# Patient Record
Sex: Female | Born: 1935
Health system: Southern US, Community
[De-identification: ages and names within clinical notes are randomized; demographics above are authoritative.]

## PROBLEM LIST (undated history)

## (undated) DIAGNOSIS — M810 Age-related osteoporosis without current pathological fracture: Secondary | ICD-10-CM

## (undated) DIAGNOSIS — I493 Ventricular premature depolarization: Secondary | ICD-10-CM

## (undated) DIAGNOSIS — E559 Vitamin D deficiency, unspecified: Secondary | ICD-10-CM

## (undated) HISTORY — DX: Ventricular premature depolarization: I49.3

## (undated) HISTORY — DX: Age-related osteoporosis without current pathological fracture: M81.0

## (undated) HISTORY — DX: Vitamin D deficiency, unspecified: E55.9

---

## 1967-06-24 HISTORY — PX: TUBAL LIGATION: SHX77

## 1999-05-23 ENCOUNTER — Encounter: Admission: RE | Admit: 1999-05-23 | Discharge: 1999-05-23 | Payer: Self-pay | Admitting: Obstetrics and Gynecology

## 1999-05-23 ENCOUNTER — Encounter: Payer: Self-pay | Admitting: Obstetrics and Gynecology

## 1999-12-10 ENCOUNTER — Other Ambulatory Visit: Admission: RE | Admit: 1999-12-10 | Discharge: 1999-12-10 | Payer: Self-pay | Admitting: Obstetrics and Gynecology

## 2000-02-12 ENCOUNTER — Encounter: Payer: Self-pay | Admitting: Obstetrics and Gynecology

## 2000-02-12 ENCOUNTER — Encounter: Admission: RE | Admit: 2000-02-12 | Discharge: 2000-02-12 | Payer: Self-pay | Admitting: Obstetrics and Gynecology

## 2001-03-24 ENCOUNTER — Other Ambulatory Visit: Admission: RE | Admit: 2001-03-24 | Discharge: 2001-03-24 | Payer: Self-pay | Admitting: Obstetrics and Gynecology

## 2001-03-31 ENCOUNTER — Encounter: Payer: Self-pay | Admitting: Obstetrics and Gynecology

## 2001-03-31 ENCOUNTER — Encounter: Admission: RE | Admit: 2001-03-31 | Discharge: 2001-03-31 | Payer: Self-pay | Admitting: Obstetrics and Gynecology

## 2002-03-31 ENCOUNTER — Other Ambulatory Visit: Admission: RE | Admit: 2002-03-31 | Discharge: 2002-03-31 | Payer: Self-pay | Admitting: Obstetrics and Gynecology

## 2002-04-04 ENCOUNTER — Encounter: Admission: RE | Admit: 2002-04-04 | Discharge: 2002-04-04 | Payer: Self-pay | Admitting: Obstetrics and Gynecology

## 2002-04-04 ENCOUNTER — Encounter: Payer: Self-pay | Admitting: Obstetrics and Gynecology

## 2003-03-24 ENCOUNTER — Encounter: Admission: RE | Admit: 2003-03-24 | Discharge: 2003-03-24 | Payer: Self-pay | Admitting: Obstetrics and Gynecology

## 2003-03-24 ENCOUNTER — Encounter: Payer: Self-pay | Admitting: Obstetrics and Gynecology

## 2003-04-18 ENCOUNTER — Encounter: Admission: RE | Admit: 2003-04-18 | Discharge: 2003-04-18 | Payer: Self-pay | Admitting: Obstetrics and Gynecology

## 2003-05-01 ENCOUNTER — Other Ambulatory Visit: Admission: RE | Admit: 2003-05-01 | Discharge: 2003-05-01 | Payer: Self-pay | Admitting: Obstetrics and Gynecology

## 2004-05-13 ENCOUNTER — Encounter: Admission: RE | Admit: 2004-05-13 | Discharge: 2004-05-13 | Payer: Self-pay | Admitting: Obstetrics and Gynecology

## 2004-09-20 ENCOUNTER — Ambulatory Visit: Payer: Self-pay | Admitting: Internal Medicine

## 2004-09-25 ENCOUNTER — Ambulatory Visit: Payer: Self-pay | Admitting: Cardiology

## 2004-11-27 ENCOUNTER — Encounter: Admission: RE | Admit: 2004-11-27 | Discharge: 2004-11-27 | Payer: Self-pay | Admitting: Obstetrics and Gynecology

## 2005-01-30 ENCOUNTER — Ambulatory Visit: Payer: Self-pay | Admitting: Gastroenterology

## 2005-02-12 ENCOUNTER — Ambulatory Visit: Payer: Self-pay | Admitting: Gastroenterology

## 2005-06-02 ENCOUNTER — Encounter: Admission: RE | Admit: 2005-06-02 | Discharge: 2005-06-02 | Payer: Self-pay | Admitting: Obstetrics and Gynecology

## 2005-06-24 ENCOUNTER — Encounter: Admission: RE | Admit: 2005-06-24 | Discharge: 2005-06-24 | Payer: Self-pay | Admitting: Family Medicine

## 2006-03-16 ENCOUNTER — Other Ambulatory Visit: Admission: RE | Admit: 2006-03-16 | Discharge: 2006-03-16 | Payer: Self-pay | Admitting: Obstetrics and Gynecology

## 2006-06-23 LAB — HM COLONOSCOPY

## 2006-06-23 LAB — HM DEXA SCAN

## 2006-07-23 ENCOUNTER — Encounter: Admission: RE | Admit: 2006-07-23 | Discharge: 2006-07-23 | Payer: Self-pay | Admitting: Obstetrics and Gynecology

## 2006-08-07 ENCOUNTER — Encounter: Admission: RE | Admit: 2006-08-07 | Discharge: 2006-08-07 | Payer: Self-pay | Admitting: Obstetrics and Gynecology

## 2007-08-20 ENCOUNTER — Encounter: Admission: RE | Admit: 2007-08-20 | Discharge: 2007-08-20 | Payer: Self-pay | Admitting: Obstetrics and Gynecology

## 2008-08-28 ENCOUNTER — Encounter: Admission: RE | Admit: 2008-08-28 | Discharge: 2008-08-28 | Payer: Self-pay | Admitting: Emergency Medicine

## 2008-08-29 LAB — HM DEXA SCAN

## 2009-09-07 ENCOUNTER — Encounter: Admission: RE | Admit: 2009-09-07 | Discharge: 2009-09-07 | Payer: Self-pay | Admitting: Obstetrics and Gynecology

## 2010-07-14 ENCOUNTER — Encounter: Payer: Self-pay | Admitting: Obstetrics and Gynecology

## 2010-10-07 ENCOUNTER — Other Ambulatory Visit: Payer: Self-pay | Admitting: Obstetrics and Gynecology

## 2010-10-07 DIAGNOSIS — Z1231 Encounter for screening mammogram for malignant neoplasm of breast: Secondary | ICD-10-CM

## 2010-10-09 ENCOUNTER — Ambulatory Visit
Admission: RE | Admit: 2010-10-09 | Discharge: 2010-10-09 | Disposition: A | Discharge status: Home / Self Care | Payer: Medicare Other | Source: Ambulatory Visit | Attending: Obstetrics and Gynecology | Admitting: Obstetrics and Gynecology

## 2010-10-09 DIAGNOSIS — Z1231 Encounter for screening mammogram for malignant neoplasm of breast: Secondary | ICD-10-CM

## 2011-10-09 ENCOUNTER — Other Ambulatory Visit: Payer: Self-pay | Admitting: Obstetrics and Gynecology

## 2011-10-09 DIAGNOSIS — Z1231 Encounter for screening mammogram for malignant neoplasm of breast: Secondary | ICD-10-CM

## 2011-10-28 ENCOUNTER — Ambulatory Visit: Payer: Medicare Other

## 2011-11-11 ENCOUNTER — Ambulatory Visit
Admission: RE | Admit: 2011-11-11 | Discharge: 2011-11-11 | Disposition: A | Discharge status: Home / Self Care | Payer: Medicare Other | Source: Ambulatory Visit | Attending: Obstetrics and Gynecology | Admitting: Obstetrics and Gynecology

## 2011-11-11 DIAGNOSIS — Z1231 Encounter for screening mammogram for malignant neoplasm of breast: Secondary | ICD-10-CM

## 2012-10-25 ENCOUNTER — Other Ambulatory Visit: Payer: Self-pay

## 2012-10-25 DIAGNOSIS — Z1231 Encounter for screening mammogram for malignant neoplasm of breast: Secondary | ICD-10-CM

## 2012-11-30 ENCOUNTER — Ambulatory Visit: Payer: PRIVATE HEALTH INSURANCE

## 2012-12-01 ENCOUNTER — Ambulatory Visit: Payer: PRIVATE HEALTH INSURANCE

## 2012-12-23 ENCOUNTER — Ambulatory Visit
Admission: RE | Admit: 2012-12-23 | Discharge: 2012-12-23 | Disposition: A | Discharge status: Home / Self Care | Payer: Medicare Other | Source: Ambulatory Visit

## 2012-12-23 DIAGNOSIS — Z1231 Encounter for screening mammogram for malignant neoplasm of breast: Secondary | ICD-10-CM

## 2013-05-12 ENCOUNTER — Encounter: Payer: Self-pay | Admitting: Family Medicine

## 2013-05-12 ENCOUNTER — Ambulatory Visit (INDEPENDENT_AMBULATORY_CARE_PROVIDER_SITE_OTHER): Payer: Medicare Other | Admitting: Family Medicine

## 2013-05-12 ENCOUNTER — Encounter (INDEPENDENT_AMBULATORY_CARE_PROVIDER_SITE_OTHER): Payer: Self-pay

## 2013-05-12 VITALS — BP 122/55 | HR 69 | Temp 98.9°F | Ht 59.0 in | Wt 100.6 lb

## 2013-05-12 DIAGNOSIS — H60399 Other infective otitis externa, unspecified ear: Secondary | ICD-10-CM

## 2013-05-12 DIAGNOSIS — H60391 Other infective otitis externa, right ear: Secondary | ICD-10-CM

## 2013-05-12 MED ORDER — NEOMYCIN-POLYMYXIN-HC 3.5-10000-1 OT SOLN: 4.0000 [drp] | Freq: Four times a day (QID) | OTIC | Status: DC

## 2013-05-12 NOTE — Patient Instructions (Signed)
Otitis Externa Otitis externa is a bacterial or fungal infection of the outer ear canal. This is the area from the eardrum to the outside of the ear. Otitis externa is sometimes called "swimmer's ear." CAUSES  Possible causes of infection include:  Swimming in dirty water.  Moisture remaining in the ear after swimming or bathing.  Mild injury (trauma) to the ear.  Objects stuck in the ear (foreign body).  Cuts or scrapes (abrasions) on the outside of the ear. SYMPTOMS  The first symptom of infection is often itching in the ear canal. Later signs and symptoms may include swelling and redness of the ear canal, ear pain, and yellowish-white fluid (pus) coming from the ear. The ear pain may be worse when pulling on the earlobe. DIAGNOSIS  Your caregiver will perform a physical exam. A sample of fluid may be taken from the ear and examined for bacteria or fungi. TREATMENT  Antibiotic ear drops are often given for 10 to 14 days. Treatment may also include pain medicine or corticosteroids to reduce itching and swelling. PREVENTION   Keep your ear dry. Use the corner of a towel to absorb water out of the ear canal after swimming or bathing.  Avoid scratching or putting objects inside your ear. This can damage the ear canal or remove the protective wax that lines the canal. This makes it easier for bacteria and fungi to grow.  Avoid swimming in lakes, polluted water, or poorly chlorinated pools.  You may use ear drops made of rubbing alcohol and vinegar after swimming. Combine equal parts of white vinegar and alcohol in a bottle. Put 3 or 4 drops into each ear after swimming. HOME CARE INSTRUCTIONS   Apply antibiotic ear drops to the ear canal as prescribed by your caregiver.  Only take over-the-counter or prescription medicines for pain, discomfort, or fever as directed by your caregiver.  If you have diabetes, follow any additional treatment instructions from your caregiver.  Keep all  follow-up appointments as directed by your caregiver. SEEK MEDICAL CARE IF:   You have a fever.  Your ear is still red, swollen, painful, or draining pus after 3 days.  Your redness, swelling, or pain gets worse.  You have a severe headache.  You have redness, swelling, pain, or tenderness in the area behind your ear. MAKE SURE YOU:   Understand these instructions.  Will watch your condition.  Will get help right away if you are not doing well or get worse. Document Released: 06/09/2005 Document Revised: 09/01/2011 Document Reviewed: 06/26/2011 ExitCare Patient Information 2014 [employer].  

## 2013-05-12 NOTE — Progress Notes (Signed)
  Subjective:    Patient ID: Julia Hinton, female    DOB: May 10, 1933, 79 y.o.   MRN: [id number]  HPI This 79 y.o. female presents for evaluation of right ear discomfort for 3 days.  She wears a hearing Aid and she states it hurts to wear her hearing aid.   Review of Systems No chest pain, SOB, HA, dizziness, vision change, N/V, diarrhea, constipation, dysuria, urinary urgency or frequency, myalgias, arthralgias or rash.     Objective:   Physical Exam Vital signs noted  Well developed well nourished female.  HEENT - Head atraumatic Normocephalic                Eyes - PERRLA, Conjuctiva - clear Sclera- Clear EOMI                Ears - EAC's with partial cerumen impaction bilateral and TM's normal. Respiratory - Lungs CTA bilateral Cardiac - RRR S1 and S2 without murmur GI - Abdomen soft Nontender and bowel sounds active x 4 Extremities - No edema. Neuro - Grossly intact.       Assessment & Plan:  Otitis, externa, infective, right - Plan: neomycin-polymyxin-hydrocortisone (CORTISPORIN) otic solution Deatra Canter FNP

## 2013-12-21 ENCOUNTER — Other Ambulatory Visit: Payer: Self-pay

## 2013-12-21 DIAGNOSIS — Z1231 Encounter for screening mammogram for malignant neoplasm of breast: Secondary | ICD-10-CM

## 2014-01-10 ENCOUNTER — Ambulatory Visit: Payer: PRIVATE HEALTH INSURANCE

## 2014-01-31 ENCOUNTER — Ambulatory Visit: Payer: PRIVATE HEALTH INSURANCE

## 2014-02-13 ENCOUNTER — Encounter (INDEPENDENT_AMBULATORY_CARE_PROVIDER_SITE_OTHER): Payer: Self-pay

## 2014-02-13 ENCOUNTER — Ambulatory Visit
Admission: RE | Admit: 2014-02-13 | Discharge: 2014-02-13 | Disposition: A | Discharge status: Home / Self Care | Payer: Medicare Other | Source: Ambulatory Visit

## 2014-02-13 DIAGNOSIS — Z1231 Encounter for screening mammogram for malignant neoplasm of breast: Secondary | ICD-10-CM

## 2014-03-14 ENCOUNTER — Ambulatory Visit (INDEPENDENT_AMBULATORY_CARE_PROVIDER_SITE_OTHER): Payer: Medicare Other | Admitting: *Deleted

## 2014-03-14 DIAGNOSIS — Z111 Encounter for screening for respiratory tuberculosis: Secondary | ICD-10-CM

## 2014-03-14 NOTE — Patient Instructions (Signed)

## 2014-03-14 NOTE — Progress Notes (Signed)
Patient ID: Julia Hinton, female   DOB: 02/13/1933, 80 y.o.   MRN: [id number] Ppd skin test placed on left forearm.

## 2014-03-16 ENCOUNTER — Encounter: Payer: Self-pay | Admitting: *Deleted

## 2014-03-16 LAB — TB SKIN TEST
Induration: 0 mm
TB Skin Test: NEGATIVE

## 2014-10-31 ENCOUNTER — Ambulatory Visit: Payer: Self-pay | Admitting: *Deleted

## 2014-11-29 ENCOUNTER — Other Ambulatory Visit: Payer: Self-pay

## 2014-11-29 DIAGNOSIS — Z1231 Encounter for screening mammogram for malignant neoplasm of breast: Secondary | ICD-10-CM

## 2015-02-20 ENCOUNTER — Ambulatory Visit
Admission: RE | Admit: 2015-02-20 | Discharge: 2015-02-20 | Disposition: A | Discharge status: Home / Self Care | Payer: Medicare Other | Source: Ambulatory Visit

## 2015-02-20 DIAGNOSIS — Z1231 Encounter for screening mammogram for malignant neoplasm of breast: Secondary | ICD-10-CM

## 2015-06-01 ENCOUNTER — Telehealth: Payer: Self-pay | Admitting: Family Medicine

## 2015-06-24 LAB — HM MAMMOGRAPHY: HM Mammogram: NORMAL (ref 0–4)

## 2015-09-04 DIAGNOSIS — H9193 Unspecified hearing loss, bilateral: Secondary | ICD-10-CM | POA: Insufficient documentation

## 2015-09-04 DIAGNOSIS — H6993 Unspecified Eustachian tube disorder, bilateral: Secondary | ICD-10-CM | POA: Insufficient documentation

## 2015-09-04 DIAGNOSIS — H9201 Otalgia, right ear: Secondary | ICD-10-CM | POA: Insufficient documentation

## 2016-03-19 ENCOUNTER — Other Ambulatory Visit: Payer: Self-pay | Admitting: Obstetrics and Gynecology

## 2016-03-19 DIAGNOSIS — Z1231 Encounter for screening mammogram for malignant neoplasm of breast: Secondary | ICD-10-CM

## 2016-04-08 ENCOUNTER — Ambulatory Visit
Admission: RE | Admit: 2016-04-08 | Discharge: 2016-04-08 | Disposition: A | Discharge status: Home / Self Care | Payer: Medicare Other | Source: Ambulatory Visit | Attending: Obstetrics and Gynecology | Admitting: Obstetrics and Gynecology

## 2016-04-08 ENCOUNTER — Other Ambulatory Visit: Payer: Self-pay | Admitting: Obstetrics and Gynecology

## 2016-04-08 DIAGNOSIS — Z1231 Encounter for screening mammogram for malignant neoplasm of breast: Secondary | ICD-10-CM

## 2016-04-08 LAB — HM MAMMOGRAPHY: HM Mammogram: NORMAL (ref 0–4)

## 2017-02-03 ENCOUNTER — Other Ambulatory Visit: Payer: Self-pay | Admitting: Obstetrics and Gynecology

## 2017-02-03 DIAGNOSIS — Z1231 Encounter for screening mammogram for malignant neoplasm of breast: Secondary | ICD-10-CM

## 2017-03-05 ENCOUNTER — Encounter: Payer: Self-pay | Admitting: *Deleted

## 2017-03-11 ENCOUNTER — Encounter: Payer: Self-pay | Admitting: Internal Medicine

## 2017-03-11 ENCOUNTER — Non-Acute Institutional Stay: Payer: Medicare Other | Admitting: Internal Medicine

## 2017-03-11 VITALS — BP 116/64 | HR 68 | Temp 98.2°F | Resp 18 | Ht 59.0 in

## 2017-03-11 DIAGNOSIS — I493 Ventricular premature depolarization: Secondary | ICD-10-CM

## 2017-03-11 DIAGNOSIS — E559 Vitamin D deficiency, unspecified: Secondary | ICD-10-CM

## 2017-03-11 DIAGNOSIS — Z13220 Encounter for screening for lipoid disorders: Secondary | ICD-10-CM

## 2017-03-11 DIAGNOSIS — M810 Age-related osteoporosis without current pathological fracture: Secondary | ICD-10-CM | POA: Insufficient documentation

## 2017-03-11 NOTE — Progress Notes (Signed)
[facility]  Provider: Blanchie Serve MD   Location:  [place] of Service:  Clinic (12)  PCP: Blanchie Serve, MD Patient Care Team: Blanchie Serve, MD as PCP - General (Internal Medicine) Califon, Nelda Bucks, NP as Nurse Practitioner (Family Medicine)  Extended Emergency Contact Information Primary Emergency Contact: Delduca,[name] Address: [address]          [place],  [address] [address] of [place] Phone: [phone] Mobile Phone: [phone] Relation: Spouse   Goals of Care: Advanced Directive information Advanced Directives 03/11/2017  Does Patient Have a Medical Advance Directive? Yes  Type of Paramedic of [place];Living will  Copy of [place] in Chart? No - copy requested      Chief Complaint  Patient presents with  . New Patient (Initial Visit)    Establishing care with [facility]. She has questions about the supplements she's currently taking.   . Medication Refill    No refills needed at this time.     HPI: Patient is a 83 y.o. female seen today to establish care. She has not had a PCP before for several years. She follows with her GYN once a year and follows with eye and ear doctors. She remotely had palpitations and was seen by cardiology and was diagnosed to have PVCs. Currently on calcium and vitamin d supplement only. Provides history of osteoporosis and having been on fosamax in the past. No other medical history.   Past Medical History:  Diagnosis Date  . Osteoporosis   . PVC (premature ventricular contraction)   . Vitamin D deficiency    Past Surgical History:  Procedure Laterality Date  . TUBAL LIGATION  1969    reports that she has never smoked. She has never used smokeless tobacco. She reports that she drinks about 1.2 oz of alcohol per week . She reports that she does not use drugs. Social History   Social History    . Marital status: Married    Spouse name: Jenny Reichmann  . Number of children: 4  . Years of education: 4   Occupational History  . Retired Education officer, museum    Social History Main Topics  . Smoking status: Never Smoker  . Smokeless tobacco: Never Used  . Alcohol use 1.2 oz/week    2 Glasses of wine per week  . Drug use: No  . Sexual activity: No   Other Topics Concern  . 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 Dementia Mother   . Osteoporosis Mother     Health Maintenance  Topic Date Due  . PNA vac Low Risk Adult (1 of 2 - PCV13) 02/16/2000  . INFLUENZA VACCINE  01/21/2017  . TETANUS/TDAP  02/15/2024  . DEXA SCAN  Completed    Allergies  Allergen Reactions  . Sulfa Antibiotics     Outpatient Encounter Prescriptions as of 03/11/2017  Medication  Sig  . Cholecalciferol 1000 units tablet Take 1,000 Units by mouth daily.  . Multiple Vitamins-Calcium (VIACTIV MULTI-VITAMIN) CHEW Chew 2 tablets by mouth daily.  Marland Kitchen neomycin-polymyxin-hydrocortisone (CORTISPORIN) otic solution Place 4 drops into the right ear 4 (four) times daily.   No facility-administered encounter medications on file as of 03/11/2017.     Review of Systems  Constitutional: Negative for appetite change, chills, diaphoresis, fatigue and fever.  HENT: Negative for congestion, hearing loss, mouth sores, rhinorrhea, sinus pressure, sore throat, tinnitus, trouble swallowing and voice change.        Seasonal allergies, occasional runny nose to left nostril  Eyes:       Wears corrective glasses, has had cataract surgery. Follows with Dr Katy Fitch  Respiratory: Negative  for cough, chest tightness, shortness of breath and wheezing.   Cardiovascular: Negative for chest pain, palpitations and leg swelling.  Gastrointestinal: Negative for abdominal pain, blood in stool, diarrhea, nausea and vomiting.  Genitourinary: Negative for dysuria, frequency, hematuria, pelvic pain and urgency.       Follows with Gyn once a year. Wakes up once at night to urinate  Musculoskeletal: Negative for arthralgias, back pain, gait problem and joint swelling.       No falls and no use of assistive device  Skin: Negative for color change.  Neurological: Negative for dizziness, seizures, syncope, light-headedness, numbness and headaches.       Has positional vertigo and slow position change helps  Hematological: Does not bruise/bleed easily.  Psychiatric/Behavioral: Negative for behavioral problems, confusion and sleep disturbance. The patient is not nervous/anxious.     Vitals:   03/11/17 0959  BP: 116/64  Pulse: 68  Resp: 18  Temp: 98.2 F (36.8 C)  TempSrc: Oral  SpO2: 99%  Height: 4' 11"  (1.499 m)   There is no height or weight on file to calculate BMI. Physical Exam  Constitutional: She is oriented to person, place, and time. She appears well-developed and well-nourished. No distress.  HENT:  Head: Normocephalic and atraumatic.  Nose: Nose normal.  Mouth/Throat: Oropharynx is clear and moist. No oropharyngeal exudate.  Right ear hearing aid, left ear impacted with cerumen  Eyes: Pupils are equal, round, and reactive to light. Conjunctivae and EOM are normal.  Has corrective glasses  Neck: Normal range of motion. Neck supple. No JVD present. No thyromegaly present.  Cardiovascular: Normal rate, regular rhythm and intact distal pulses.   No murmur heard. Pulmonary/Chest: Effort normal and breath sounds normal. No respiratory distress. She has no wheezes. She has no rales. She exhibits no tenderness.  Abdominal: Soft. Bowel sounds are normal. She exhibits no  distension and no mass. There is no tenderness. There is no rebound and no guarding.  Musculoskeletal: Normal range of motion. She exhibits deformity. She exhibits no edema.  Arthritis changes to fingers  Lymphadenopathy:    She has no cervical adenopathy.  Neurological: She is alert and oriented to person, place, and time.  Skin: Skin is warm and dry. No rash noted. She is not diaphoretic. No erythema.  Psychiatric: She has a normal mood and affect. Her behavior is normal. Judgment normal.    Labs reviewed: Basic Metabolic Panel: No results for input(s): NA, K, CL, CO2, GLUCOSE, BUN, CREATININE, CALCIUM, MG, PHOS in the last 8760 hours. Liver Function Tests: No results for input(s): AST, ALT, ALKPHOS, BILITOT, PROT, ALBUMIN in the last 8760 hours. No results for input(s): LIPASE, AMYLASE in the last 8760 hours. No results for input(s): AMMONIA in the  last 8760 hours. CBC: No results for input(s): WBC, NEUTROABS, HGB, HCT, MCV, PLT in the last 8760 hours. Cardiac Enzymes: No results for input(s): CKTOTAL, CKMB, CKMBINDEX, TROPONINI in the last 8760 hours. BNP: Invalid input(s): POCBNP No results found for: HGBA1C No results found for: TSH No results found for: VITAMINB12 No results found for: FOLATE No results found for: IRON, TIBC, FERRITIN  Lipid Panel: No results for input(s): CHOL, HDL, LDLCALC, TRIG, CHOLHDL, LDLDIRECT in the last 8760 hours. No results found for: HGBA1C  Procedures since last visit: No results found.  Assessment/Plan  1. Vitamin D deficiency Continue calcium and vitamin d supplement. - CBC with Differential/Platelets; Future - TSH; Future - Vitamin D, 25-hydroxy; Future - DG Bone Density; Future  2. Osteoporosis, unspecified osteoporosis type, unspecified pathological fracture presence No fall reported. Was taking fosamax in past, stopped for several years, no recent dexa scan, obtain one. Fall precautions. Continue calcium and vitamin d  supplement - CMP with eGFR; Future - Lipid Panel; Future - CBC with Differential/Platelets; Future - TSH; Future - DG Bone Density; Future  3. Screening for lipid disorders Check lipid panel with her age, none available for review  4. Asymptomatic PVCs Currently denies any symptom, regular heart rate on exam. - CMP with eGFR; Future - Lipid Panel; Future - CBC with Differential/Platelets; Future - TSH; Future   Labs/tests ordered:  As above  Next appointment: 6 months for annual exam, MMSE, EKG  Communication: reviewed care plan with patient    Blanchie Serve, MD Internal Medicine [place], [place] [address] Cell Phone (Monday-Friday 8 am - 5 pm): [phone] On Call: [phone] and follow prompts after 5 pm and on weekends Office Phone: [phone] Office Fax: [phone]

## 2017-04-10 ENCOUNTER — Ambulatory Visit: Payer: Medicare Other

## 2017-04-13 ENCOUNTER — Non-Acute Institutional Stay: Payer: Medicare Other

## 2017-04-16 ENCOUNTER — Telehealth: Payer: Self-pay

## 2017-04-16 NOTE — Telephone Encounter (Signed)
Left a message on the answer machine for the patient to call back to discuss lab appointment.

## 2017-04-17 ENCOUNTER — Other Ambulatory Visit: Payer: Self-pay

## 2017-04-17 DIAGNOSIS — E559 Vitamin D deficiency, unspecified: Secondary | ICD-10-CM

## 2017-04-17 DIAGNOSIS — Z13220 Encounter for screening for lipoid disorders: Secondary | ICD-10-CM

## 2017-04-17 DIAGNOSIS — M810 Age-related osteoporosis without current pathological fracture: Secondary | ICD-10-CM

## 2017-04-17 DIAGNOSIS — I493 Ventricular premature depolarization: Secondary | ICD-10-CM

## 2017-04-20 LAB — LIPID PANEL
Cholesterol: 231 mg/dL — ABNORMAL HIGH (ref <200)
HDL: 86 mg/dL (ref >50)
LDL Cholesterol (Calc): 127 mg/dL (calc) — ABNORMAL HIGH
Non-HDL Cholesterol (Calc): 145 mg/dL (calc) — ABNORMAL HIGH (ref <130)
Total CHOL/HDL Ratio: 2.7 (calc) (ref <5.0)
Triglycerides: 82 mg/dL (ref <150)

## 2017-04-20 LAB — CBC WITH DIFFERENTIAL/PLATELET
Basophils Absolute: 58 cells/uL (ref 0–200)
Basophils Relative: 0.8 %
Eosinophils Absolute: 180 cells/uL (ref 15–500)
Eosinophils Relative: 2.5 %
HCT: 44.2 % (ref 35.0–45.0)
Hemoglobin: 14.9 g/dL (ref 11.7–15.5)
Lymphs Abs: 2304 cells/uL (ref 850–3900)
MCH: 29.5 pg (ref 27.0–33.0)
MCHC: 33.7 g/dL (ref 32.0–36.0)
MCV: 87.5 fL (ref 80.0–100.0)
MPV: 10.3 fL (ref 7.5–12.5)
Monocytes Relative: 9.3 %
Neutro Abs: 3989 cells/uL (ref 1500–7800)
Neutrophils Relative %: 55.4 %
Platelets: 310 10*3/uL (ref 140–400)
RBC: 5.05 10*6/uL (ref 3.80–5.10)
RDW: 12.5 % (ref 11.0–15.0)
Total Lymphocyte: 32 %
WBC mixed population: 670 cells/uL (ref 200–950)
WBC: 7.2 10*3/uL (ref 3.8–10.8)

## 2017-04-20 LAB — COMPLETE METABOLIC PANEL WITH GFR
AG Ratio: 1.7 (calc) (ref 1.0–2.5)
ALT: 16 U/L (ref 6–29)
AST: 21 U/L (ref 10–35)
Albumin: 4.5 g/dL (ref 3.6–5.1)
Alkaline phosphatase (APISO): 57 U/L (ref 33–130)
BUN: 14 mg/dL (ref 7–25)
CO2: 31 mmol/L (ref 20–32)
Calcium: 9.9 mg/dL (ref 8.6–10.4)
Chloride: 100 mmol/L (ref 98–110)
Creat: 0.88 mg/dL (ref 0.60–0.88)
GFR, Est African American: 71 mL/min/{1.73_m2} (ref >=60)
GFR, Est Non African American: 61 mL/min/{1.73_m2} (ref >=60)
Globulin: 2.7 g/dL (calc) (ref 1.9–3.7)
Glucose, Bld: 87 mg/dL (ref 65–99)
Potassium: 4.2 mmol/L (ref 3.5–5.3)
Sodium: 141 mmol/L (ref 135–146)
Total Bilirubin: 0.6 mg/dL (ref 0.2–1.2)
Total Protein: 7.2 g/dL (ref 6.1–8.1)

## 2017-04-20 LAB — VITAMIN D 25 HYDROXY (VIT D DEFICIENCY, FRACTURES): Vit D, 25-Hydroxy: 65 ng/mL (ref 30–100)

## 2017-04-20 LAB — TSH: TSH: 3.79 mIU/L (ref 0.40–4.50)

## 2017-04-27 ENCOUNTER — Other Ambulatory Visit: Payer: Self-pay | Admitting: Internal Medicine

## 2017-04-27 DIAGNOSIS — M810 Age-related osteoporosis without current pathological fracture: Secondary | ICD-10-CM

## 2017-04-27 DIAGNOSIS — E559 Vitamin D deficiency, unspecified: Secondary | ICD-10-CM

## 2017-04-29 ENCOUNTER — Ambulatory Visit: Payer: Medicare Other

## 2017-04-29 ENCOUNTER — Other Ambulatory Visit: Payer: Medicare Other

## 2017-05-19 ENCOUNTER — Other Ambulatory Visit: Payer: Self-pay

## 2017-05-19 DIAGNOSIS — M810 Age-related osteoporosis without current pathological fracture: Secondary | ICD-10-CM

## 2017-05-19 DIAGNOSIS — I493 Ventricular premature depolarization: Secondary | ICD-10-CM

## 2017-05-22 ENCOUNTER — Ambulatory Visit: Payer: Medicare Other

## 2017-05-28 ENCOUNTER — Telehealth: Payer: Self-pay

## 2017-05-28 NOTE — Telephone Encounter (Signed)
Spoke with the patient and she stated that she rescheduled her Dexa scan and mammogram for 06/24/17.

## 2017-05-29 ENCOUNTER — Other Ambulatory Visit: Payer: Medicare Other

## 2017-06-24 ENCOUNTER — Ambulatory Visit
Admission: RE | Admit: 2017-06-24 | Discharge: 2017-06-24 | Disposition: A | Discharge status: Home / Self Care | Payer: Medicare Other | Source: Ambulatory Visit | Attending: Internal Medicine | Admitting: Internal Medicine

## 2017-06-24 ENCOUNTER — Ambulatory Visit
Admission: RE | Admit: 2017-06-24 | Discharge: 2017-06-24 | Disposition: A | Discharge status: Home / Self Care | Payer: Medicare Other | Source: Ambulatory Visit | Attending: Obstetrics and Gynecology | Admitting: Obstetrics and Gynecology

## 2017-06-24 DIAGNOSIS — Z1231 Encounter for screening mammogram for malignant neoplasm of breast: Secondary | ICD-10-CM

## 2017-06-24 DIAGNOSIS — E559 Vitamin D deficiency, unspecified: Secondary | ICD-10-CM

## 2017-06-24 DIAGNOSIS — M810 Age-related osteoporosis without current pathological fracture: Secondary | ICD-10-CM

## 2017-06-24 IMAGING — MG 2D DIGITAL SCREENING BILATERAL MAMMOGRAM WITH 3D TOMO WITH CAD
9 of 13 series · 9 of 29 positions shown · non-contrast
Comparison: Previous exam(s).

CLINICAL DATA: Screening.

EXAM:
2D DIGITAL SCREENING BILATERAL MAMMOGRAM WITH 3D TOMO WITH CAD

[R MLO (1 of 2)]
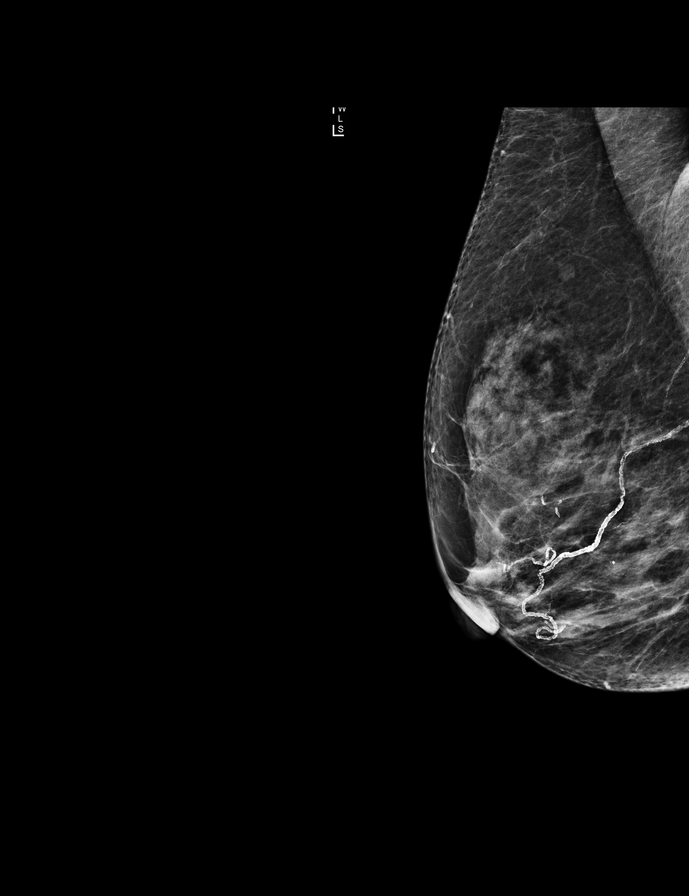

[L CC synth-2D]
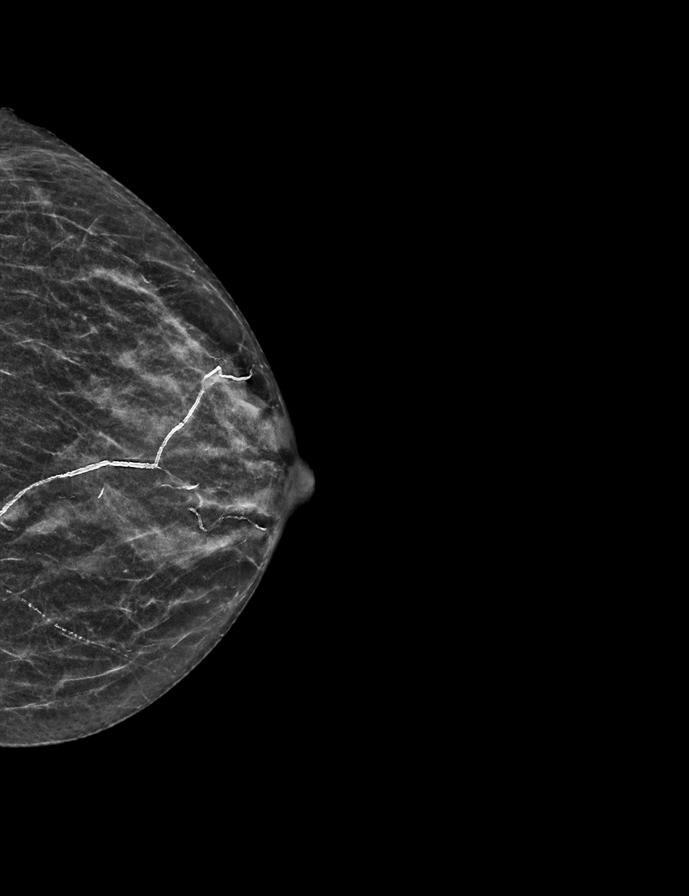

[R MLO (2 of 2)]
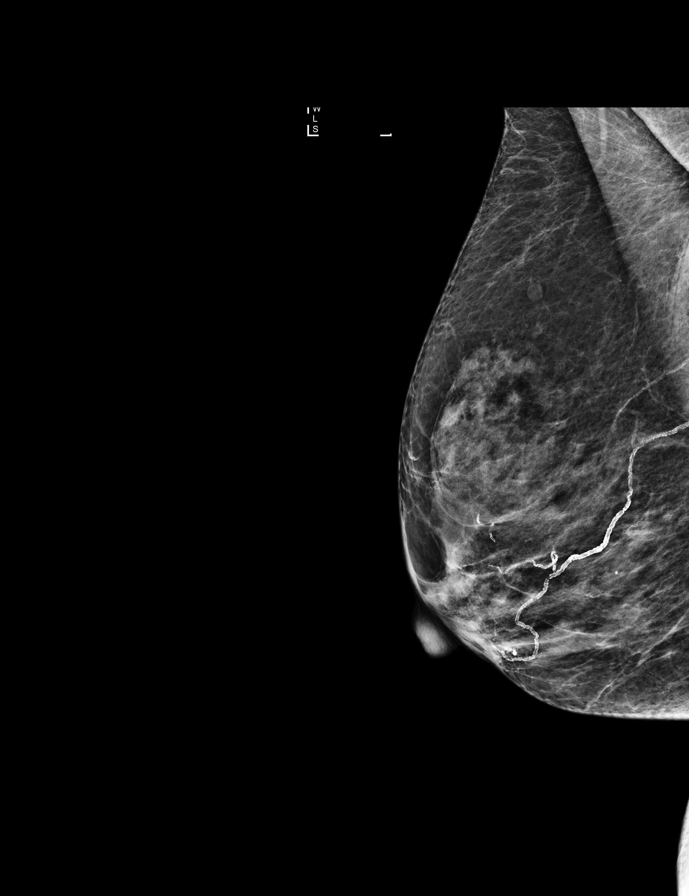

[R MLO synth-2D]
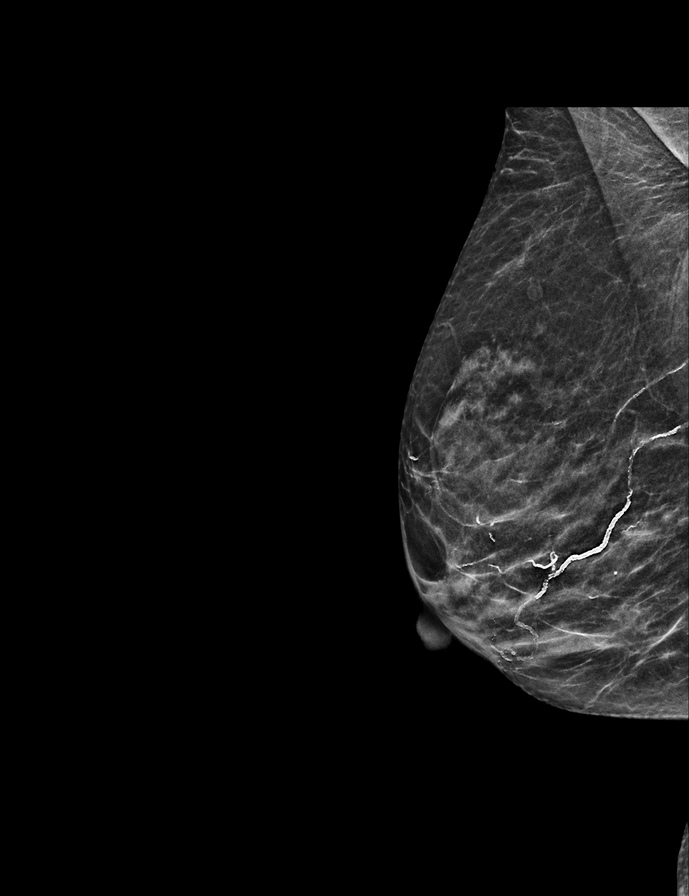

[R CC synth-2D]
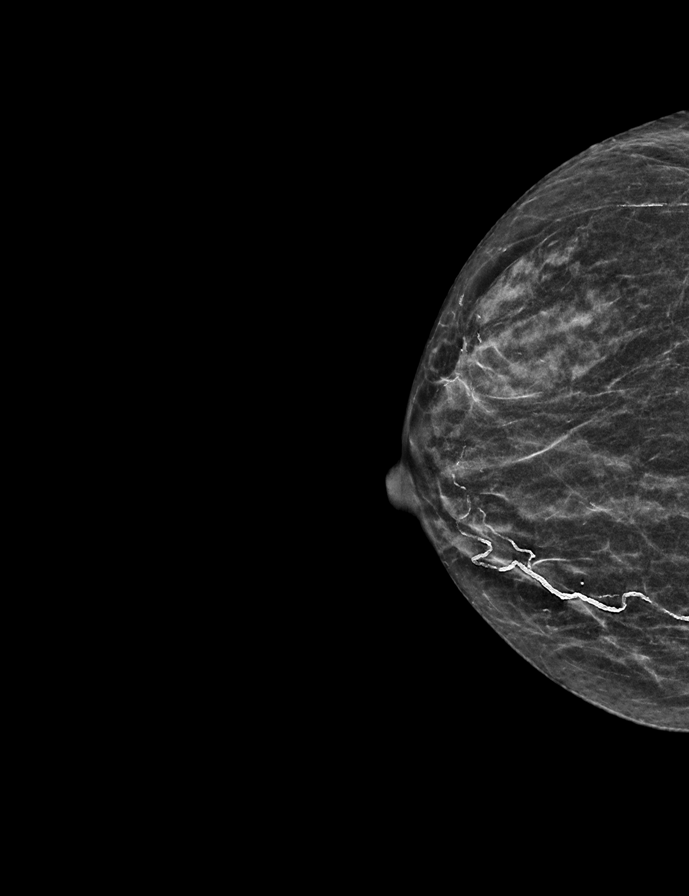

[L CC]
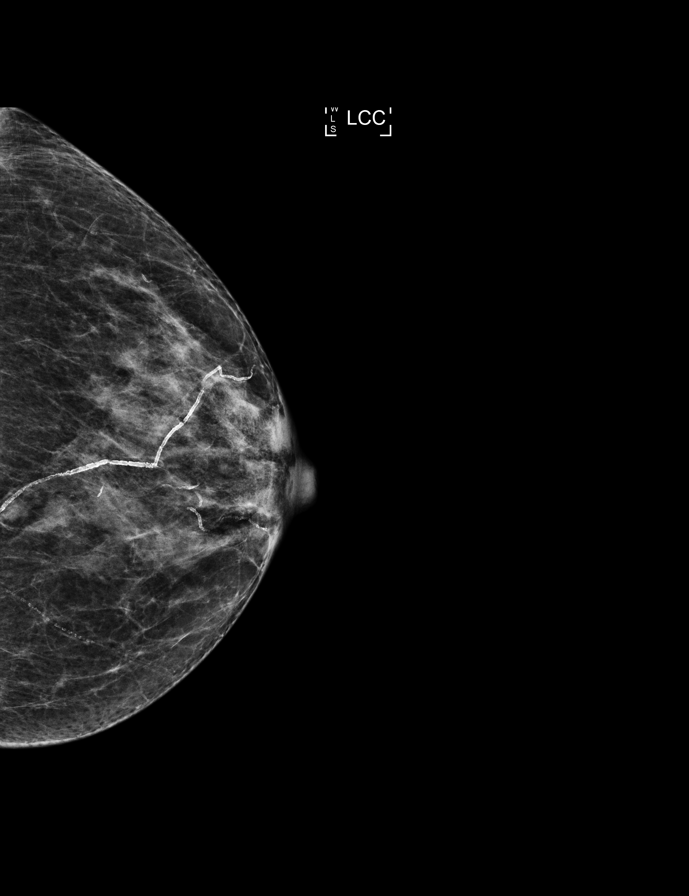

[L MLO]
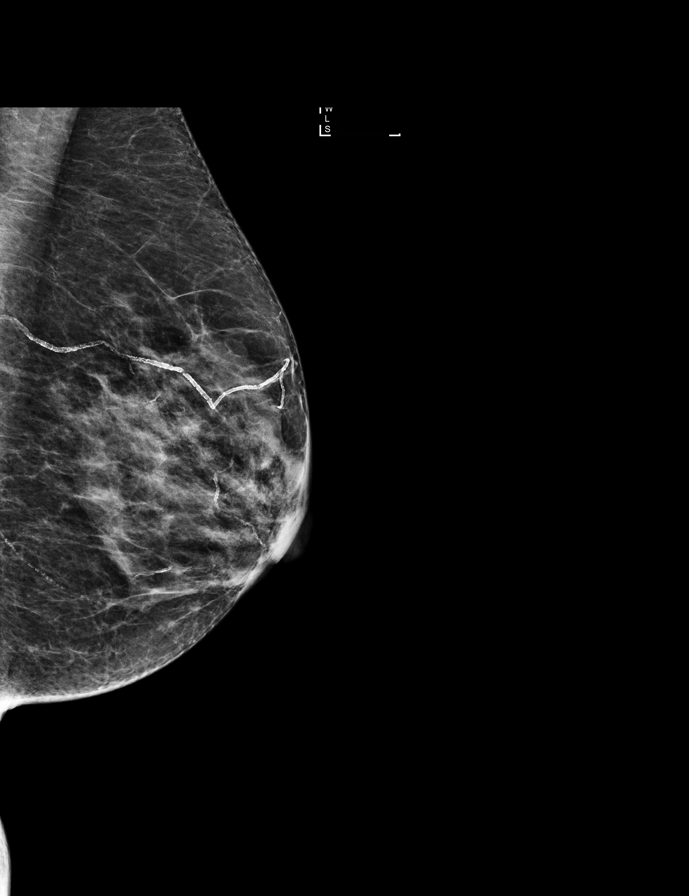

[R CC]
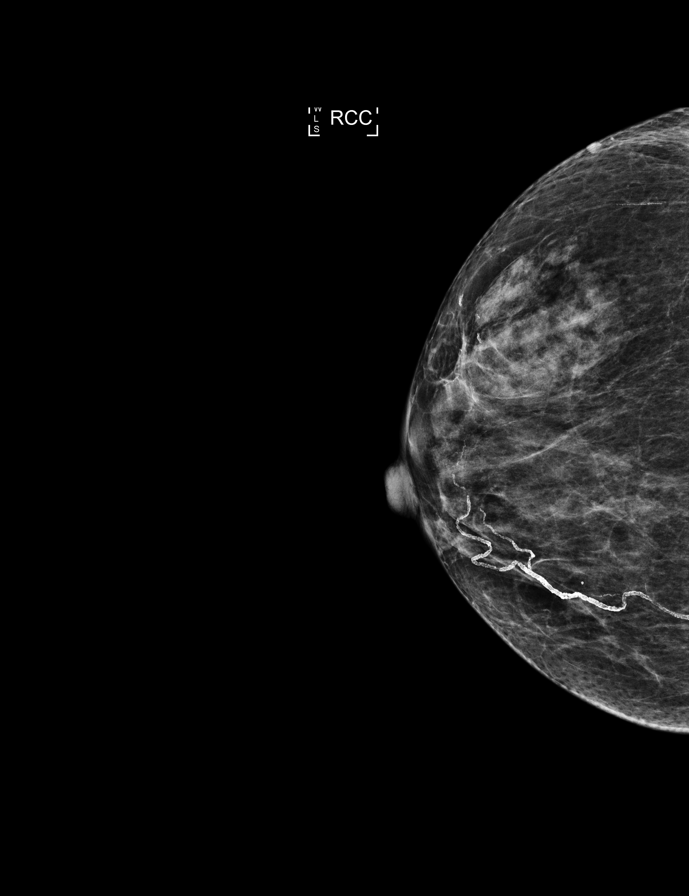

[L MLO synth-2D]
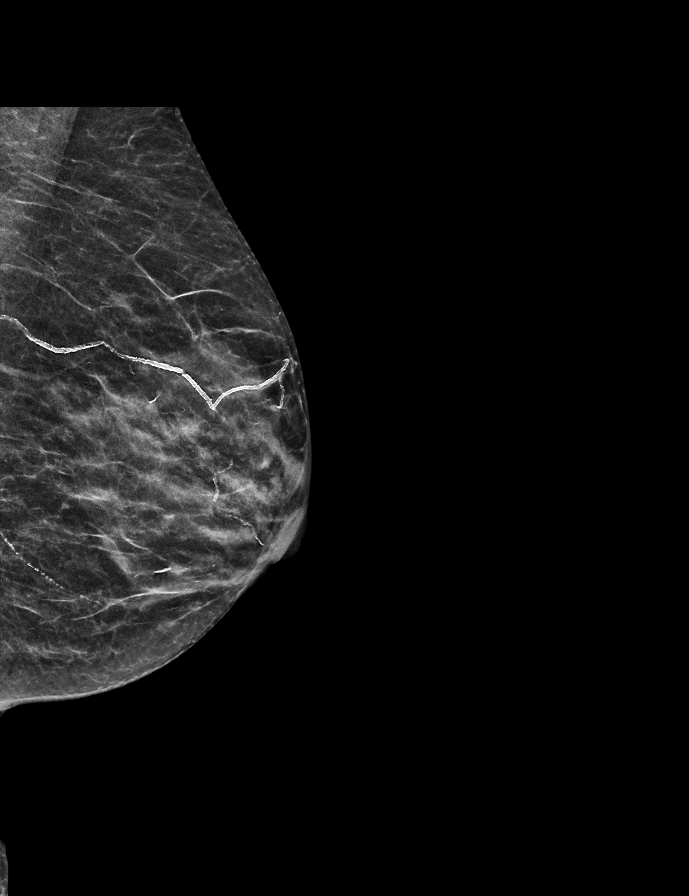

[9 of 29 positions shown; findings below may reference images not displayed]

ACR Breast Density Category c: The breast tissue is heterogeneously
dense, which may obscure small masses.
FINDINGS: There are no findings suspicious for malignancy. Images were
processed with CAD.
IMPRESSION: No mammographic evidence of malignancy. A result letter of this
screening mammogram will be mailed directly to the patient.

RECOMMENDATION:
Screening mammogram in one year. (Code:[4B])

BI-RADS CATEGORY  1: Negative.

## 2017-07-23 ENCOUNTER — Other Ambulatory Visit: Payer: Self-pay

## 2017-07-23 DIAGNOSIS — M810 Age-related osteoporosis without current pathological fracture: Secondary | ICD-10-CM

## 2017-07-23 DIAGNOSIS — I493 Ventricular premature depolarization: Secondary | ICD-10-CM

## 2017-08-03 LAB — CBC WITH DIFFERENTIAL/PLATELET
Basophils Absolute: 62 cells/uL (ref 0–200)
Basophils Relative: 1 %
Eosinophils Absolute: 149 cells/uL (ref 15–500)
Eosinophils Relative: 2.4 %
HCT: 45.6 % — ABNORMAL HIGH (ref 35.0–45.0)
Hemoglobin: 15.4 g/dL (ref 11.7–15.5)
Lymphs Abs: 1835 cells/uL (ref 850–3900)
MCH: 29.6 pg (ref 27.0–33.0)
MCHC: 33.8 g/dL (ref 32.0–36.0)
MCV: 87.7 fL (ref 80.0–100.0)
MPV: 10.1 fL (ref 7.5–12.5)
Monocytes Relative: 9.4 %
Neutro Abs: 3571 cells/uL (ref 1500–7800)
Neutrophils Relative %: 57.6 %
Platelets: 292 10*3/uL (ref 140–400)
RBC: 5.2 10*6/uL — ABNORMAL HIGH (ref 3.80–5.10)
RDW: 12.5 % (ref 11.0–15.0)
Total Lymphocyte: 29.6 %
WBC mixed population: 583 cells/uL (ref 200–950)
WBC: 6.2 10*3/uL (ref 3.8–10.8)

## 2017-08-03 LAB — COMPLETE METABOLIC PANEL WITH GFR
AG Ratio: 1.7 (calc) (ref 1.0–2.5)
ALT: 11 U/L (ref 6–29)
AST: 22 U/L (ref 10–35)
Albumin: 4.3 g/dL (ref 3.6–5.1)
Alkaline phosphatase (APISO): 52 U/L (ref 33–130)
BUN: 17 mg/dL (ref 7–25)
CO2: 29 mmol/L (ref 20–32)
Calcium: 9.8 mg/dL (ref 8.6–10.4)
Chloride: 103 mmol/L (ref 98–110)
Creat: 0.79 mg/dL (ref 0.60–0.88)
GFR, Est African American: 81 mL/min/{1.73_m2} (ref >=60)
GFR, Est Non African American: 70 mL/min/{1.73_m2} (ref >=60)
Globulin: 2.5 g/dL (calc) (ref 1.9–3.7)
Glucose, Bld: 81 mg/dL (ref 65–99)
Potassium: 4.3 mmol/L (ref 3.5–5.3)
Sodium: 142 mmol/L (ref 135–146)
Total Bilirubin: 0.5 mg/dL (ref 0.2–1.2)
Total Protein: 6.8 g/dL (ref 6.1–8.1)

## 2017-08-03 LAB — VITAMIN D 25 HYDROXY (VIT D DEFICIENCY, FRACTURES): Vit D, 25-Hydroxy: 73 ng/mL (ref 30–100)

## 2017-08-10 ENCOUNTER — Telehealth: Payer: Self-pay | Admitting: Internal Medicine

## 2017-08-10 NOTE — Telephone Encounter (Signed)
I left a message asking the patient to call me at [phone] [facility]22 or 2/27. VDM (DD)

## 2017-08-19 ENCOUNTER — Non-Acute Institutional Stay: Payer: Medicare Other

## 2017-08-19 VITALS — BP 132/60 | HR 68 | Temp 97.9°F | Ht 59.0 in | Wt 100.0 lb

## 2017-08-19 DIAGNOSIS — Z0000 Encounter for general adult medical examination without abnormal findings: Secondary | ICD-10-CM | POA: Diagnosis not present

## 2017-08-19 MED ORDER — ZOSTER VAC RECOMB ADJUVANTED 50 MCG/0.5ML IM SUSR: 0.5000 mL | Freq: Once | INTRAMUSCULAR | 1 refills | Status: AC

## 2017-08-19 NOTE — Patient Instructions (Signed)
Julia Hinton , Thank you for taking time to come for your Medicare Wellness Visit. I appreciate your ongoing commitment to your health goals. Please review the following plan we discussed and let me know if I can assist you in the future.   Screening recommendations/referrals: Colonoscopy excluded, you are over age 82 Mammogram up to date Bone Density up to date Recommended yearly ophthalmology/optometry visit for glaucoma screening and checkup Recommended yearly dental visit for hygiene and checkup  Vaccinations: Influenza vaccine up to date, due 2019 fall season Pneumococcal vaccine -please check your records before your visit next week Tdap vaccine up to date, due 02/15/2024 Shingles vaccine due, prescription sent to pharmacy    Advanced directives: in chart  Conditions/risks identified: none  Next appointment: Dr. Glade LloydPandey 08/26/2017 @ 10am   Preventive Care 65 Years and Older, Female Preventive care refers to lifestyle choices and visits with your health care provider that can promote health and wellness. What does preventive care include?  A yearly physical exam. This is also called an annual well check.  Dental exams once or twice a year.  Routine eye exams. Ask your health care provider how often you should have your eyes checked.  Personal lifestyle choices, including:  Daily care of your teeth and gums.  Regular physical activity.  Eating a healthy diet.  Avoiding tobacco and drug use.  Limiting alcohol use.  Practicing safe sex.  Taking low-dose aspirin every day.  Taking vitamin and mineral supplements as recommended by your health care provider. What happens during an annual well check? The services and screenings done by your health care provider during your annual well check will depend on your age, overall health, lifestyle risk factors, and family history of disease. Counseling  Your health care provider may ask you questions about your:  Alcohol  use.  Tobacco use.  Drug use.  Emotional well-being.  Home and relationship well-being.  Sexual activity.  Eating habits.  History of falls.  Memory and ability to understand (cognition).  Work and work Astronomerenvironment.  Reproductive health. Screening  You may have the following tests or measurements:  Height, weight, and BMI.  Blood pressure.  Lipid and cholesterol levels. These may be checked every 5 years, or more frequently if you are over 82 years old.  Skin check.  Lung cancer screening. You may have this screening every year starting at age 82 if you have a 30-pack-year history of smoking and currently smoke or have quit within the past 15 years.  Fecal occult blood test (FOBT) of the stool. You may have this test every year starting at age 82.  Flexible sigmoidoscopy or colonoscopy. You may have a sigmoidoscopy every 5 years or a colonoscopy every 10 years starting at age 82.  Hepatitis C blood test.  Hepatitis B blood test.  Sexually transmitted disease (STD) testing.  Diabetes screening. This is done by checking your blood sugar (glucose) after you have not eaten for a while (fasting). You may have this done every 1-3 years.  Bone density scan. This is done to screen for osteoporosis. You may have this done starting at age 82.  Mammogram. This may be done every 1-2 years. Talk to your health care provider about how often you should have regular mammograms. Talk with your health care provider about your test results, treatment options, and if necessary, the need for more tests. Vaccines  Your health care provider may recommend certain vaccines, such as:  Influenza vaccine. This is recommended every  year.  Tetanus, diphtheria, and acellular pertussis (Tdap, Td) vaccine. You may need a Td booster every 10 years.  Zoster vaccine. You may need this after age 61.  Pneumococcal 13-valent conjugate (PCV13) vaccine. One dose is recommended after age  11.  Pneumococcal polysaccharide (PPSV23) vaccine. One dose is recommended after age 51. Talk to your health care provider about which screenings and vaccines you need and how often you need them. This information is not intended to replace advice given to you by your health care provider. Make sure you discuss any questions you have with your health care provider. Document Released: 07/06/2015 Document Revised: 02/27/2016 Document Reviewed: 04/10/2015 Elsevier Interactive Patient Education  2017 [place] Prevention in the Home Falls can cause injuries. They can happen to people of all ages. There are many things you can do to make your home safe and to help prevent falls. What can I do on the outside of my home?  Regularly fix the edges of walkways and driveways and fix any cracks.  Remove anything that might make you trip as you walk through a door, such as a raised step or threshold.  Trim any bushes or trees on the path to your home.  Use bright outdoor lighting.  Clear any walking paths of anything that might make someone trip, such as rocks or tools.  Regularly check to see if handrails are loose or broken. Make sure that both sides of any steps have handrails.  Any raised decks and porches should have guardrails on the edges.  Have any leaves, snow, or ice cleared regularly.  Use sand or salt on walking paths during winter.  Clean up any spills in your garage right away. This includes oil or grease spills. What can I do in the bathroom?  Use night lights.  Install grab bars by the toilet and in the tub and shower. Do not use towel bars as grab bars.  Use non-skid mats or decals in the tub or shower.  If you need to sit down in the shower, use a plastic, non-slip stool.  Keep the floor dry. Clean up any water that spills on the floor as soon as it happens.  Remove soap buildup in the tub or shower regularly.  Attach bath mats securely with double-sided  non-slip rug tape.  Do not have throw rugs and other things on the floor that can make you trip. What can I do in the bedroom?  Use night lights.  Make sure that you have a light by your bed that is easy to reach.  Do not use any sheets or blankets that are too big for your bed. They should not hang down onto the floor.  Have a firm chair that has side arms. You can use this for support while you get dressed.  Do not have throw rugs and other things on the floor that can make you trip. What can I do in the kitchen?  Clean up any spills right away.  Avoid walking on wet floors.  Keep items that you use a lot in easy-to-reach places.  If you need to reach something above you, use a strong step stool that has a grab bar.  Keep electrical cords out of the way.  Do not use floor polish or wax that makes floors slippery. If you must use wax, use non-skid floor wax.  Do not have throw rugs and other things on the floor that can make you trip. What can  I do with my stairs?  Do not leave any items on the stairs.  Make sure that there are handrails on both sides of the stairs and use them. Fix handrails that are broken or loose. Make sure that handrails are as long as the stairways.  Check any carpeting to make sure that it is firmly attached to the stairs. Fix any carpet that is loose or worn.  Avoid having throw rugs at the top or bottom of the stairs. If you do have throw rugs, attach them to the floor with carpet tape.  Make sure that you have a light switch at the top of the stairs and the bottom of the stairs. If you do not have them, ask someone to add them for you. What else can I do to help prevent falls?  Wear shoes that:  Do not have high heels.  Have rubber bottoms.  Are comfortable and fit you well.  Are closed at the toe. Do not wear sandals.  If you use a stepladder:  Make sure that it is fully opened. Do not climb a closed stepladder.  Make sure that both  sides of the stepladder are locked into place.  Ask someone to hold it for you, if possible.  Clearly mark and make sure that you can see:  Any grab bars or handrails.  First and last steps.  Where the edge of each step is.  Use tools that help you move around (mobility aids) if they are needed. These include:  Canes.  Walkers.  Scooters.  Crutches.  Turn on the lights when you go into a dark area. Replace any light bulbs as soon as they burn out.  Set up your furniture so you have a clear path. Avoid moving your furniture around.  If any of your floors are uneven, fix them.  If there are any pets around you, be aware of where they are.  Review your medicines with your doctor. Some medicines can make you feel dizzy. This can increase your chance of falling. Ask your doctor what other things that you can do to help prevent falls. This information is not intended to replace advice given to you by your health care provider. Make sure you discuss any questions you have with your health care provider. Document Released: 04/05/2009 Document Revised: 11/15/2015 Document Reviewed: 07/14/2014 Elsevier Interactive Patient Education  2017 [employer].

## 2017-08-19 NOTE — Progress Notes (Addendum)
Subjective:   Julia Hinton is a 84 y.o. female who presents for an Initial Medicare Annual Wellness Visit at [facility]       Objective:    Today's Vitals   08/19/17 1538  BP: 132/60  Pulse: 68  Temp: 97.9 F (36.6 H[place])  TempSrc: Oral  SpO2: 97%  Weight: 100 lb (45.4 kg)  Height: 4\' 11"  (1.499 m)   Body mass index is 20.2 kg/m.  Advanced Directives 08/19/2017 03/11/2017  Does Patient Have a Medical Advance Directive? Yes Yes  Type of Estate agent of [place];Living will Healthcare Power of [place];Living will  Does patient want to make changes to medical advance directive? No - Patient declined -  Copy of Healthcare Power of Attorney in Chart? Yes No - copy requested    Current Medications (verified) Outpatient Encounter Medications as of 08/19/2017  Medication Sig  . Cholecalciferol 1000 units tablet Take 1,000 Units by mouth daily.  . Multiple Vitamins-Calcium (VIACTIV MULTI-VITAMIN) CHEW Chew 2 tablets by mouth daily.  Marland Kitchen Zoster Vaccine Adjuvanted [facility]) injection Inject 0.5 mLs into the muscle once for 1 dose.  . [DISCONTINUED] Zoster Vaccine Adjuvanted [facility]) injection Inject 0.5 mLs into the muscle once.   No facility-administered encounter medications on file as of 08/19/2017.     Allergies (verified) Sulfa antibiotics   History: Past Medical History:  Diagnosis Date  . Osteoporosis   . PVC (premature ventricular contraction)   . Vitamin D deficiency    Past Surgical History:  Procedure Laterality Date  . TUBAL LIGATION  1969   Family History  Problem Relation Age of Onset  . Dementia Mother   . Osteoporosis Mother    Social History   Socioeconomic History  . Marital status: Married    Spouse name: Julia Hinton  . Number of children: 4  . Years of education: 4  . Highest education level: None  Social Needs  . Financial resource strain: Not hard at all  . Food insecurity - worry: Never true    . Food insecurity - inability: Never true  . Transportation needs - medical: No  . Transportation needs - non-medical: No  Occupational History  . Occupation: Retired Engineer, site  Tobacco Use  . Smoking status: Never Smoker  . Smokeless tobacco: Never Used  Substance and Sexual Activity  . Alcohol use: Yes    Alcohol/week: 1.2 oz    Types: 2 Glasses of wine per week  . Drug use: No  . Sexual activity: No  Other Topics Concern  . 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   Walks "a little everyday"   Living Will, DNR and HCPOA per patient   + glasses       Tobacco Counseling Counseling given: Not Answered   Clinical Intake:  Pre-visit preparation completed: No  Pain : No/denies pain     Nutritional Risks: None Diabetes: No  How often do you need to have someone help you when you read instructions, pamphlets, or other written materials from your doctor or pharmacy?: 1 - Never What is the last grade level you completed in school?: College  Interpreter Needed?: No  Information entered by :: Julia Russell, RN   Activities of Daily Living In your present state of health,  do you have any difficulty performing the following activities: 08/19/2017  Hearing? Y  Vision? N  Difficulty concentrating or making decisions? N  Walking or climbing stairs? N  Dressing or bathing? N  Doing errands, shopping? N  Preparing Food and eating ? N  Using the Toilet? N  In the past six months, have you accidently leaked urine? N  Do you have problems with loss of bowel control? N  Managing your Medications? N  Managing your Finances? N  Housekeeping or managing your Housekeeping? N  Some recent data might be hidden     Immunizations and Health Maintenance Immunization History  Administered Date(s)  Administered  . Influenza Split 03/30/2013  . Influenza, High Dose Seasonal PF 04/01/2017  . Influenza-Unspecified 04/14/2014, 04/11/2015  . PPD Test 03/14/2014  . Tdap 02/14/2014   Health Maintenance Due  Topic Date Due  . PNA vac Low Risk Adult (1 of 2 - PCV13) 02/16/2000    Patient Care Team: Julia Hinton, Mahima, MD as PCP - General (Internal Medicine) Julia Hinton, Julia Citrininah C, NP as Nurse Practitioner (Family Medicine)  Indicate any recent Medical Services you may have received from other than [facility] providers in the past year (date may be approximate).     Assessment:   This is a routine wellness examination for Julia Hinton.  Hearing/Vision screen Hearing Screening Comments: R hearing aid Vision Screening Comments: Sees Dr. Lavona MoundGrout annually  Dietary issues and exercise activities discussed: Current Exercise Habits: Home exercise routine, Type of exercise: walking, Time (Minutes): 20, Frequency (Times/Week): 5, Weekly Exercise (Minutes/Week): 100, Intensity: Mild, Exercise limited by: None identified  Goals    None     Depression Screen PHQ 2/9 Scores 08/19/2017  PHQ - 2 Score 0    Fall Risk Fall Risk  08/19/2017  Falls in the past year? No    Is the patient's home free of loose throw rugs in walkways, pet beds, electrical cords, etc?   yes      Grab bars in the bathroom? yes      Handrails on the stairs?   yes      Adequate lighting?   yes  Cognitive Function: MMSE - Mini Mental State Exam 08/19/2017  Orientation to time 5  Orientation to Place 5  Registration 3  Attention/ Calculation 5  Recall 1  Language- name 2 objects 2  Language- repeat 1  Language- follow 3 step command 3  Language- read & follow direction 1  Write a sentence 1  Copy design 1  Total score 28        Screening Tests Health Maintenance  Topic Date Due  . PNA vac Low Risk Adult (1 of 2 - PCV13) 02/16/2000  . TETANUS/TDAP  02/15/2024  . INFLUENZA VACCINE  Completed  . DEXA SCAN  Completed     Qualifies for Shingles Vaccine? Yes, educated and prescription sent to pharmacy  Cancer Screenings: Lung: Low Dose CT Chest recommended if Age 62-80 years, 30 pack-year currently smoking OR have quit w/in 15years. Patient does not qualify. Breast: Up to date on Mammogram? Yes   Up to date of Bone Density/Dexa? Yes Colorectal: up to date  Additional Screenings:  Hepatitis [place] Screening: declined PNA vaccine-pt stated she got them and is going to double check her records     Plan:    I have personally reviewed and addressed the Medicare Annual Wellness questionnaire and have noted the following in the patient's chart:  A. Medical and social history B. Use of alcohol,  tobacco or illicit drugs  [place]. Current medications and supplements D. Functional ability and status E.  Nutritional status F.  Physical activity G. Advance directives H. List of other physicians I.  Hospitalizations, surgeries, and ER visits in previous 12 months J.  Vitals K. Screenings to include hearing, vision, cognitive, depression L. Referrals and appointments - none  In addition, I have reviewed and discussed with patient certain preventive protocols, quality metrics, and best practice recommendations. A written personalized care plan for preventive services as well as general preventive health recommendations were provided to patient.  See attached scanned questionnaire for additional information.   Signed,   Julia Russell, RN Nurse Health Advisor  Patient Concerns:None

## 2017-08-26 ENCOUNTER — Encounter: Payer: Self-pay | Admitting: Internal Medicine

## 2017-08-26 ENCOUNTER — Ambulatory Visit: Payer: Medicare Other | Admitting: Internal Medicine

## 2017-08-26 VITALS — BP 118/62 | HR 85 | Temp 97.4°F | Resp 16 | Ht 59.0 in | Wt 98.8 lb

## 2017-08-26 DIAGNOSIS — M810 Age-related osteoporosis without current pathological fracture: Secondary | ICD-10-CM | POA: Diagnosis not present

## 2017-08-26 DIAGNOSIS — Z0000 Encounter for general adult medical examination without abnormal findings: Secondary | ICD-10-CM | POA: Diagnosis not present

## 2017-08-26 DIAGNOSIS — I4510 Unspecified right bundle-branch block: Secondary | ICD-10-CM

## 2017-08-26 DIAGNOSIS — Z7185 Encounter for immunization safety counseling: Secondary | ICD-10-CM

## 2017-08-26 DIAGNOSIS — I493 Ventricular premature depolarization: Secondary | ICD-10-CM | POA: Diagnosis not present

## 2017-08-26 DIAGNOSIS — E559 Vitamin D deficiency, unspecified: Secondary | ICD-10-CM | POA: Diagnosis not present

## 2017-08-26 DIAGNOSIS — E785 Hyperlipidemia, unspecified: Secondary | ICD-10-CM | POA: Diagnosis not present

## 2017-08-26 DIAGNOSIS — Z7189 Other specified counseling: Secondary | ICD-10-CM

## 2017-08-26 MED ORDER — ALENDRONATE SODIUM 70 MG PO TABS: 70.0000 mg | ORAL_TABLET | ORAL | 3 refills | Status: DC

## 2017-08-26 NOTE — Progress Notes (Addendum)
[facility]  Provider: Blanchie Serve MD   Location:      Place of Service:     PCP: Blanchie Serve, MD Patient Care Team: Blanchie Serve, MD as PCP - General (Internal Medicine) Ngetich, Nelda Bucks, NP as Nurse Practitioner (Family Medicine)  Extended Emergency Contact Information Primary Emergency Contact: Yingling,[name] Address: [address]          [place], [place] [address] Johnnette Litter of [place] Phone: [phone] Mobile Phone: [phone] Relation: Spouse   Goals of Care: Advanced Directive information Advanced Directives 08/19/2017  Does Patient Have a Medical Advance Directive? Yes  Type of Paramedic of [place];Living will  Does patient want to make changes to medical advance directive? No - Patient declined  Copy of [place] in Chart? Yes      Chief Complaint  Patient presents with  . Annual Exam    annual physical  . Medication Refill    No refills needed at this time  . Results    Discuss labs    HPI: Patient is a 84 y.o. female seen today for annual physical visit. She mentions having received pneumococcal and shingles vaccine, unsure about dates.   PVCs- denies any symptom this visit.   Osteoporosis- dexa with T score -2.7 on 06/24/17. dexa 08/29/08 T score -1.9. Has been on fosamax in past with improved result. Then has been off fosamax for several years. No fall or fracture reported. Takes calcium and vitamin d supplement.   Vitamin d deficiency- takes vitamin d supplement.   Immunization History  Administered Date(s) Administered  . Influenza Split 03/30/2013  . Influenza, High Dose Seasonal PF 04/01/2017  . Influenza-Unspecified 04/14/2014, 04/11/2015  . PPD Test 03/14/2014  . Tdap 02/14/2014     Past Medical History:  Diagnosis Date  . Osteoporosis   . PVC (premature ventricular contraction)   . Vitamin D deficiency    Past Surgical History:    Procedure Laterality Date  . TUBAL LIGATION  1969    reports that  has never smoked. she has never used smokeless tobacco. She reports that she drinks about 1.2 oz of alcohol per week. She reports that she does not use drugs. Social History   Socioeconomic History  . Marital status: Married    Spouse name: Jenny Reichmann  . Number of children: 4  . Years of education: 4  . Highest education level: Not on file  Social Needs  . Financial resource strain: Not hard at all  . Food insecurity - worry: Never true  . Food insecurity - inability: Never true  . Transportation needs - medical: No  . Transportation needs - non-medical: No  Occupational History  . Occupation: Retired Education officer, museum  Tobacco Use  . Smoking status: Never Smoker  . Smokeless tobacco: Never Used  Substance and Sexual Activity  . Alcohol use: Yes    Alcohol/week: 1.2 oz    Types: 2 Glasses of wine per week  . Drug use: No  . Sexual activity: No  Other Topics Concern  . 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 Dementia Mother   . Osteoporosis Mother     Health Maintenance  Topic Date Due  . PNA vac Low Risk Adult (1 of 2 - PCV13) 02/16/2000  . TETANUS/TDAP  02/15/2024  . INFLUENZA VACCINE  Completed  . DEXA SCAN  Completed    Allergies  Allergen Reactions  . Sulfa Antibiotics     Outpatient Encounter Medications as of 08/26/2017  Medication Sig  . Cholecalciferol 1000 units tablet Take 1,000 Units by mouth daily.  . Multiple Vitamins-Calcium (VIACTIV MULTI-VITAMIN) CHEW Chew 2 tablets by mouth daily.  Marland Kitchen alendronate (FOSAMAX) 70 MG tablet Take 1 tablet (70 mg  total) by mouth once a week. Take with a full glass of water on an empty stomach.   No facility-administered encounter medications on file as of 08/26/2017.     Review of Systems  Constitutional: Negative for appetite change, chills and fever.  HENT: Positive for hearing loss. Negative for congestion, sinus pressure, sinus pain and trouble swallowing.   Eyes: Positive for visual disturbance. Negative for pain, discharge and itching.       History of cataract surgery to both eyes with placement of IOL  Respiratory: Negative for cough, shortness of breath and wheezing.   Cardiovascular: Negative for chest pain, palpitations and leg swelling.  Gastrointestinal: Negative for abdominal pain, blood in stool, constipation, diarrhea, nausea and vomiting.  Genitourinary: Negative for dysuria, hematuria and vaginal discharge.       Wakes up 1-2 times at night to urinate  Musculoskeletal: Negative for back pain and gait problem.       No fall reported  Skin: Negative for rash.  Neurological: Negative for dizziness, light-headedness, numbness and headaches.  Hematological: Does not bruise/bleed easily.  Psychiatric/Behavioral: Negative for behavioral problems, dysphoric mood, self-injury and suicidal ideas. The patient is not nervous/anxious.     Vitals:   08/26/17 1009  BP: 118/62  Pulse: 85  Resp: 16  Temp: (!) 97.4 F (36.3 C)  TempSrc: Oral  SpO2: 99%  Weight: 98 lb 12.8 oz (44.8 kg)  Height: _0  (1.499 m)   Body mass index is 19.96 kg/m.   Wt Readings from Last 3 Encounters:  08/26/17 98 lb 12.8 oz (44.8 kg)  08/19/17 100 lb (45.4 kg)  05/12/13 100 lb 9.6 oz (45.6 kg)   Physical Exam  Constitutional: She is oriented to person, place, and time. No distress.  Thin built, elderly female  HENT:  Head: Normocephalic and atraumatic.  Nose: Nose normal.  Mouth/Throat: Oropharynx is clear and moist. No oropharyngeal exudate.  Hearing aid to right ear, right ear canal normal,  normal tympanic membrane, left ear impacted with cerumen  Eyes: Conjunctivae and EOM are normal. Pupils are equal, round, and reactive to light. Right eye exhibits no discharge. Left eye exhibits no discharge.  Wears corrective glasses  Neck: Normal range of motion. Neck supple. No thyromegaly present.  Cardiovascular: Normal rate, regular rhythm and intact distal pulses.  Pulmonary/Chest: Effort normal and breath sounds normal. No respiratory distress. She has no wheezes. She has no rales.  Abdominal: Soft. Bowel sounds are normal. There is no tenderness. There is no rebound and no guarding.  Musculoskeletal: She exhibits no edema.  Steady gait, able to move all 4 extremities  Lymphadenopathy:    She has no cervical adenopathy.  Neurological: She is  alert and oriented to person, place, and time. No cranial nerve deficit. She exhibits normal muscle tone.  Skin: Skin is warm and dry. No rash noted. She is not diaphoretic.  Psychiatric: She has a normal mood and affect. Her behavior is normal.    Labs reviewed: Basic Metabolic Panel: Recent Labs    04/20/17 0750 08/03/17 0000  NA 141 142  K 4.2 4.3  CL 100 103  CO2 31 29  GLUCOSE 87 81  BUN 14 17  CREATININE 0.88 0.79  CALCIUM 9.9 9.8   Liver Function Tests: Recent Labs    04/20/17 0750 08/03/17 0000  AST 21 22  ALT 16 11  BILITOT 0.6 0.5  PROT 7.2 6.8   No results for input(s): LIPASE, AMYLASE in the last 8760 hours. No results for input(s): AMMONIA in the last 8760 hours. CBC: Recent Labs    04/20/17 0750 08/03/17 0000  WBC 7.2 6.2  NEUTROABS 3,989 3,571  HGB 14.9 15.4  HCT 44.2 45.6*  MCV 87.5 87.7  PLT 310 292   Cardiac Enzymes: No results for input(s): CKTOTAL, CKMB, CKMBINDEX, TROPONINI in the last 8760 hours. BNP: Invalid input(s): POCBNP No results found for: HGBA1C Lab Results  Component Value Date   TSH 3.79 04/20/2017   No results found for: VITAMINB12 No results found for: FOLATE No results  found for: IRON, TIBC, FERRITIN  Lipid Panel: Recent Labs    04/20/17 0750  CHOL 231*  HDL 86  TRIG 82  CHOLHDL 2.7   No results found for: HGBA1C  Procedures since last visit: No results found.   08/26/17 EKG- normal sinus rhythm, 63/min, normal PR interval, LAD, right bundle branch block with broad QRS complex  Assessment/Plan  1. Asymptomatic PVCs Asymptomatic, monitor  2. Osteoporosis, unspecified osteoporosis type, unspecified pathological fracture presence Place her on fosamax 70 mg once a week. Continue calcium and vitamin d current regimen. Recheck dexa in 2 years. Continue weight bearing exercises as tolerated.   3. Vitamin D deficiency Continue vitamin d supplemen  4. Immunization counseling uptodate on influenza and tdap. Pt mentions she is uptodate with pneumococcal and shingles vaccine, think it was in 2014, she has dates in her apartment, will bring it to [place] after clinic. On review, if she is due for any immunization, will provide script for it.   5. Hyperlipidemia, unspecified hyperlipidemia type Elevated total cholesterol. Patient wants to try diet and exercise first and if no improvement will consider medication. Reading material for diet for cholesterol provided.  - CMP with eGFR([facility]) - Lipid Panel  6. Right bundle branch block (RBBB) on electrocardiogram (ECG) Asymptomatic, denies palpitations or syncope/ presyncope episode. Monitor clinically. No old ekg to compare with  7. Annual physical exam uptodate on mammogram, dexa scan, pelvic exam, immunization except for the above ones. Labs reviewed. the patient was counseled regarding the appropriate use of alcohol, regular self-examination of the breasts on a monthly basis, prevention of dental and periodontal disease, diet, regular sustained exercise for at least 30 minutes 5 times per week, the proper use of sunscreen and protective clothing, cholesterol, thyroid and diabetes screening.    Labs/tests  ordered:   Lab Orders     CMP with eGFR([facility])     Lipid Panel   Next appointment: 6 months  Communication: reviewed care plan with patient     Blanchie Serve, MD Internal Medicine [facility] [address] [place], [place] [address] Cell Phone (Monday-Friday 8 am -  5 pm): [phone] On Call: [phone] and follow prompts after 5 pm and on weekends Office Phone: [phone] Office Fax: [phone]

## 2017-08-26 NOTE — Patient Instructions (Signed)
Fat and Cholesterol Restricted Diet Getting too much fat and cholesterol in your diet may cause health problems. Following this diet helps keep your fat and cholesterol at normal levels. This can keep you from getting sick. What types of fat should I choose?  Choose monosaturated and polyunsaturated fats. These are found in foods such as olive oil, canola oil, flaxseeds, walnuts, almonds, and seeds.  Eat more omega-3 fats. Good choices include salmon, mackerel, sardines, tuna, flaxseed oil, and ground flaxseeds.  Limit saturated fats. These are in animal products such as meats, butter, and cream. They can also be in plant products such as palm oil, palm kernel oil, and coconut oil.  Avoid foods with partially hydrogenated oils in them. These contain trans fats. Examples of foods that have trans fats are stick margarine, some tub margarines, cookies, crackers, and other baked goods. What general guidelines do I need to follow?  Check food labels. Look for the words "trans fat" and "saturated fat."  When preparing a meal: ? Fill half of your plate with vegetables and green salads. ? Fill one fourth of your plate with whole grains. Look for the word "whole" as the first word in the ingredient list. ? Fill one fourth of your plate with lean protein foods.  Eat more foods that have fiber, like apples, carrots, beans, peas, and barley.  Eat more home-cooked foods. Eat less at restaurants and buffets.  Limit or avoid alcohol.  Limit foods high in starch and sugar.  Limit fried foods.  Cook foods without frying them. Baking, boiling, grilling, and broiling are all great options.  Lose weight if you are overweight. Losing even a small amount of weight can help your overall health. It can also help prevent diseases such as diabetes and heart disease. What foods can I eat? Grains Whole grains, such as whole wheat or whole grain breads, crackers, cereals, and pasta. Unsweetened oatmeal,  bulgur, barley, quinoa, or brown rice. Corn or whole wheat flour tortillas. Vegetables Fresh or frozen vegetables (raw, steamed, roasted, or grilled). Green salads. Fruits All fresh, canned (in natural juice), or frozen fruits. Meat and Other Protein Products Ground beef (85% or leaner), grass-fed beef, or beef trimmed of fat. Skinless chicken or turkey. Ground chicken or turkey. Pork trimmed of fat. All fish and seafood. Eggs. Dried beans, peas, or lentils. Unsalted nuts or seeds. Unsalted canned or dry beans. Dairy Low-fat dairy products, such as skim or 1% milk, 2% or reduced-fat cheeses, low-fat ricotta or cottage cheese, or plain low-fat yogurt. Fats and Oils Tub margarines without trans fats. Light or reduced-fat mayonnaise and salad dressings. Avocado. Olive, canola, sesame, or safflower oils. Natural peanut or almond butter (choose ones without added sugar and oil). The items listed above may not be a complete list of recommended foods or beverages. Contact your dietitian for more options. What foods are not recommended? Grains White bread. White pasta. White rice. Cornbread. Bagels, pastries, and croissants. Crackers that contain trans fat. Vegetables White potatoes. Corn. Creamed or fried vegetables. Vegetables in a cheese sauce. Fruits Dried fruits. Canned fruit in light or heavy syrup. Fruit juice. Meat and Other Protein Products Fatty cuts of meat. Ribs, chicken wings, bacon, sausage, bologna, salami, chitterlings, fatback, hot dogs, bratwurst, and packaged luncheon meats. Liver and organ meats. Dairy Whole or 2% milk, cream, half-and-half, and cream cheese. Whole milk cheeses. Whole-fat or sweetened yogurt. Full-fat cheeses. Nondairy creamers and whipped toppings. Processed cheese, cheese spreads, or cheese curds. Sweets and Desserts Corn   syrup, sugars, honey, and molasses. Candy. Jam and jelly. Syrup. Sweetened cereals. Cookies, pies, cakes, donuts, muffins, and ice  cream. Fats and Oils Butter, stick margarine, lard, shortening, ghee, or bacon fat. Coconut, palm kernel, or palm oils. Beverages Alcohol. Sweetened drinks (such as sodas, lemonade, and fruit drinks or punches). The items listed above may not be a complete list of foods and beverages to avoid. Contact your dietitian for more information. This information is not intended to replace advice given to you by your health care provider. Make sure you discuss any questions you have with your health care provider. Document Released: 12/09/2011 Document Revised: 02/14/2016 Document Reviewed: 09/08/2013 Elsevier Interactive Patient Education  2018 Elsevier Inc.  

## 2017-10-29 NOTE — Addendum Note (Signed)
Addended byOneal Grout on: 10/29/2017 02:03 PM   Modules accepted: Level of Service

## 2018-02-03 ENCOUNTER — Encounter: Payer: Self-pay | Admitting: Internal Medicine

## 2018-02-24 ENCOUNTER — Encounter: Payer: Self-pay | Admitting: Internal Medicine

## 2018-06-17 ENCOUNTER — Telehealth: Payer: Self-pay

## 2018-06-17 NOTE — Telephone Encounter (Signed)
I called patient to inform her that an appointment is overdue. Patient states she does not need an appointment and she is seeing her GYN for most of her needs, yet if she has a concern that warrants [facility] then she will call to schedule.  I informed patient that she was last seen in March 2019 and per Dr.Pandey's instructions she was to have a 6 month follow-up. An appointment was scheduled for September 2019 and patient canceled because she was going out of town and never rescheduled. Patient states she does not feel the need for an appointment despite Dr.Pandey's recommendation to follow-up in 6 months. Patient refused to schedule.  Patient aware I will send a message to providers covering [facility] as a FYI.

## 2018-07-05 ENCOUNTER — Other Ambulatory Visit: Payer: Self-pay | Admitting: Obstetrics and Gynecology

## 2018-07-05 DIAGNOSIS — Z1231 Encounter for screening mammogram for malignant neoplasm of breast: Secondary | ICD-10-CM

## 2018-08-03 ENCOUNTER — Ambulatory Visit
Admission: RE | Admit: 2018-08-03 | Discharge: 2018-08-03 | Disposition: A | Discharge status: Home / Self Care | Payer: Medicare Other | Source: Ambulatory Visit | Attending: Obstetrics and Gynecology | Admitting: Obstetrics and Gynecology

## 2018-08-03 DIAGNOSIS — Z1231 Encounter for screening mammogram for malignant neoplasm of breast: Secondary | ICD-10-CM

## 2018-09-16 ENCOUNTER — Other Ambulatory Visit: Payer: Self-pay | Admitting: Nurse Practitioner

## 2019-03-04 DIAGNOSIS — K146 Glossodynia: Secondary | ICD-10-CM | POA: Insufficient documentation

## 2019-03-04 DIAGNOSIS — K1379 Other lesions of oral mucosa: Secondary | ICD-10-CM | POA: Insufficient documentation

## 2019-07-25 ENCOUNTER — Other Ambulatory Visit: Payer: Self-pay | Admitting: Obstetrics and Gynecology

## 2019-07-25 DIAGNOSIS — Z1231 Encounter for screening mammogram for malignant neoplasm of breast: Secondary | ICD-10-CM

## 2019-08-24 ENCOUNTER — Non-Acute Institutional Stay: Payer: Medicare Other | Admitting: Internal Medicine

## 2019-08-24 ENCOUNTER — Other Ambulatory Visit: Payer: Self-pay

## 2019-08-24 ENCOUNTER — Encounter: Payer: Self-pay | Admitting: Internal Medicine

## 2019-08-24 VITALS — BP 138/70 | HR 65 | Temp 98.5°F | Ht 59.0 in | Wt 99.0 lb

## 2019-08-24 DIAGNOSIS — M810 Age-related osteoporosis without current pathological fracture: Secondary | ICD-10-CM

## 2019-08-24 DIAGNOSIS — E785 Hyperlipidemia, unspecified: Secondary | ICD-10-CM

## 2019-08-24 DIAGNOSIS — I493 Ventricular premature depolarization: Secondary | ICD-10-CM

## 2019-08-24 DIAGNOSIS — E559 Vitamin D deficiency, unspecified: Secondary | ICD-10-CM

## 2019-08-24 NOTE — Progress Notes (Signed)
Location:  [place] of Service:  Clinic (12)  Provider:   Code Status:  Goals of Care:  Advanced Directives 08/19/2017  Does Julia Hinton Have a Medical Advance Directive? Yes  Type of Paramedic of [place];Living will  Does Julia Hinton want to make changes to medical advance directive? No - Julia Hinton declined  Copy of [place] in Chart? Yes     Chief Complaint  Julia Hinton presents with  . Medical Management of Chronic Issues    Julia Hinton would like to discuss switching to Prolia or Evenity.    HPI: Julia Hinton is a 86 y.o. female seen today for medical management of chronic diseases.    Julia Hinton has history Osteoporosis and Right Eye Macular degeneration and Vit D Def  Osteoprorsis Is taking Fosamax Having some sideeffects. Wants to Know if She can change it to [place] was in 2018. Has not had any testing since then. Right Eye macular Degeneration Follows with Dr Zadie Rhine.  Lives with her husband. Independent in her ADLS and IADLS. Walks with no assist. No Falls Has daughter in [place]  Past Medical History:  Diagnosis Date  . Osteoporosis   . PVC (premature ventricular contraction)   . Vitamin D deficiency     Past Surgical History:  Procedure Laterality Date  . TUBAL LIGATION  1969    Allergies  Allergen Reactions  . Sulfa Antibiotics     Outpatient Encounter Medications as of 08/24/2019  Medication Sig  . Cholecalciferol 1000 units tablet Take 1,000 Units by mouth daily.  . Multiple Vitamins-Calcium (VIACTIV MULTI-VITAMIN) CHEW Chew 2 tablets by mouth daily.  . [DISCONTINUED] alendronate (FOSAMAX) 70 MG tablet Take 1 tablet (70 mg total) by mouth once a week. Take with a full glass of water on an empty stomach.   No facility-administered encounter medications on file as of 08/24/2019.    Review of Systems:  Review of Systems Review of Systems  Constitutional: Negative for activity change, appetite  change, chills, diaphoresis, fatigue and fever.  HENT: Negative for mouth sores, postnasal drip, rhinorrhea, sinus pain and sore throat.   Respiratory: Negative for apnea, cough, chest tightness, shortness of breath and wheezing.   Cardiovascular: Negative for chest pain, palpitations and leg swelling.  Gastrointestinal: Negative for abdominal distention, abdominal pain, constipation, diarrhea, nausea and vomiting.  Genitourinary: Negative for dysuria and frequency.  Musculoskeletal: Negative for arthralgias, joint swelling and myalgias.  Skin: Negative for rash.  Neurological: Negative for dizziness, syncope, weakness, light-headedness and numbness.  Psychiatric/Behavioral: Negative for behavioral problems, confusion and sleep disturbance.    Health Maintenance  Topic Date Due  . PNA vac Low Risk Adult (1 of 2 - PCV13) 02/16/2000  . TETANUS/TDAP  02/15/2024  . INFLUENZA VACCINE  Completed  . DEXA SCAN  Completed    Physical Exam: Vitals:   08/24/19 1610  BP: 138/70  Pulse: 65  Temp: 98.5 F (36.9 C)  SpO2: 92%  Weight: 99 lb (44.9 kg)  Height: 4\' 11"  (1.499 m)   Body mass index is 20 kg/m. Physical Exam  Constitutional: Oriented to person, place, and time. Well-developed and well-nourished.  HENT:  Head: Normocephalic.  Ears Little amount if Wax bilateral Mouth/Throat: Oropharynx is clear and moist.  Eyes: Pupils are equal, round, and reactive to light.  Neck: Neck supple.  Cardiovascular: Normal rate and normal heart sounds.  No murmur heard. Pulmonary/Chest: Effort normal and breath sounds normal. No respiratory distress. No wheezes. She has  no rales.  Abdominal: Soft. Bowel sounds are normal. No distension. There is no tenderness. There is no rebound.  Musculoskeletal: No edema.  Lymphadenopathy: none Neurological: Alert and oriented to person, place, and time.  Steady Gait with no Focal Deficits Skin: Skin is warm and dry.  Psychiatric: Normal mood and affect.  Behavior is normal. Thought content normal.    Labs reviewed: Basic Metabolic Panel: No results for input(s): NA, K, CL, CO2, GLUCOSE, BUN, CREATININE, CALCIUM, MG, PHOS, TSH in the last 8760 hours. Liver Function Tests: No results for input(s): AST, ALT, ALKPHOS, BILITOT, PROT, ALBUMIN in the last 8760 hours. No results for input(s): LIPASE, AMYLASE in the last 8760 hours. No results for input(s): AMMONIA in the last 8760 hours. CBC: No results for input(s): WBC, NEUTROABS, HGB, HCT, MCV, PLT in the last 8760 hours. Lipid Panel: No results for input(s): CHOL, HDL, LDLCALC, TRIG, CHOLHDL, LDLDIRECT in the last 8760 hours. No results found for: HGBA1C  Procedures since last visit: No results found.  Assessment/Plan Osteoporosis, unspecified osteoporosis type, unspecified pathological fracture presence - Plan: COMPLETE METABOLIC PANEL WITH GFR, TSH, Vitamin D, 25-hydroxy, CBC with Differential/Platelet, HM DEXA SCAN  Asymptomatic PVCs - Plan: COMPLETE METABOLIC PANEL WITH GFR, CBC with Differential/Platelet  Vitamin D deficiency - Plan: Vitamin D, 25-hydroxy  Hyperlipidemia,- Plan: Lipid panel, TSH, CBC with Differential/Platelet     Labs/tests ordered:  * No order type specified * Next appt:  08/29/2019   Total time spent in this Julia Hinton care encounter was 45 _  minutes; greater than 50% of the visit spent counseling Julia Hinton and staff, reviewing records , Labs and coordinating care for problems addressed at this encounter.

## 2019-08-29 ENCOUNTER — Other Ambulatory Visit: Payer: Self-pay

## 2019-08-29 DIAGNOSIS — M810 Age-related osteoporosis without current pathological fracture: Secondary | ICD-10-CM

## 2019-08-29 DIAGNOSIS — E785 Hyperlipidemia, unspecified: Secondary | ICD-10-CM

## 2019-08-29 DIAGNOSIS — E559 Vitamin D deficiency, unspecified: Secondary | ICD-10-CM

## 2019-08-29 DIAGNOSIS — I493 Ventricular premature depolarization: Secondary | ICD-10-CM

## 2019-08-29 LAB — CBC WITH DIFFERENTIAL/PLATELET
Absolute Monocytes: 650 cells/uL (ref 200–950)
Basophils Absolute: 67 cells/uL (ref 0–200)
Basophils Relative: 1 %
Eosinophils Absolute: 201 cells/uL (ref 15–500)
Eosinophils Relative: 3 %
HCT: 44.2 % (ref 35.0–45.0)
Hemoglobin: 14.5 g/dL (ref 11.7–15.5)
Lymphs Abs: 1896 cells/uL (ref 850–3900)
MCH: 30 pg (ref 27.0–33.0)
MCHC: 32.8 g/dL (ref 32.0–36.0)
MCV: 91.3 fL (ref 80.0–100.0)
MPV: 11.2 fL (ref 7.5–12.5)
Monocytes Relative: 9.7 %
Neutro Abs: 3886 cells/uL (ref 1500–7800)
Neutrophils Relative %: 58 %
Platelets: 254 10*3/uL (ref 140–400)
RBC: 4.84 10*6/uL (ref 3.80–5.10)
RDW: 13 % (ref 11.0–15.0)
Total Lymphocyte: 28.3 %
WBC: 6.7 10*3/uL (ref 3.8–10.8)

## 2019-08-29 LAB — TSH: TSH: 2.61 mIU/L (ref 0.40–4.50)

## 2019-08-29 LAB — LIPID PANEL
Cholesterol: 182 mg/dL (ref <200)
HDL: 66 mg/dL (ref >=50)
LDL Cholesterol (Calc): 96 mg/dL (calc)
Non-HDL Cholesterol (Calc): 116 mg/dL (calc) (ref <130)
Total CHOL/HDL Ratio: 2.8 (calc) (ref <5.0)
Triglycerides: 108 mg/dL (ref <150)

## 2019-08-29 LAB — VITAMIN D 25 HYDROXY (VIT D DEFICIENCY, FRACTURES): Vit D, 25-Hydroxy: 54 ng/mL (ref 30–100)

## 2019-08-29 LAB — COMPLETE METABOLIC PANEL WITH GFR
AG Ratio: 1.8 (calc) (ref 1.0–2.5)
ALT: 13 U/L (ref 6–29)
AST: 19 U/L (ref 10–35)
Albumin: 4.2 g/dL (ref 3.6–5.1)
Alkaline phosphatase (APISO): 51 U/L (ref 37–153)
BUN: 16 mg/dL (ref 7–25)
CO2: 31 mmol/L (ref 20–32)
Calcium: 9.6 mg/dL (ref 8.6–10.4)
Chloride: 103 mmol/L (ref 98–110)
Creat: 0.82 mg/dL (ref 0.60–0.88)
GFR, Est African American: 76 mL/min/{1.73_m2} (ref >=60)
GFR, Est Non African American: 66 mL/min/{1.73_m2} (ref >=60)
Globulin: 2.4 g/dL (calc) (ref 1.9–3.7)
Glucose, Bld: 80 mg/dL (ref 65–99)
Potassium: 4.3 mmol/L (ref 3.5–5.3)
Sodium: 141 mmol/L (ref 135–146)
Total Bilirubin: 0.5 mg/dL (ref 0.2–1.2)
Total Protein: 6.6 g/dL (ref 6.1–8.1)

## 2019-08-31 ENCOUNTER — Ambulatory Visit: Payer: Medicare Other

## 2019-08-31 ENCOUNTER — Ambulatory Visit
Admission: RE | Admit: 2019-08-31 | Discharge: 2019-08-31 | Disposition: A | Discharge status: Home / Self Care | Payer: Medicare Other | Source: Ambulatory Visit | Attending: Obstetrics and Gynecology | Admitting: Obstetrics and Gynecology

## 2019-08-31 ENCOUNTER — Telehealth: Payer: Self-pay | Admitting: *Deleted

## 2019-08-31 ENCOUNTER — Other Ambulatory Visit: Payer: Self-pay

## 2019-08-31 DIAGNOSIS — Z1231 Encounter for screening mammogram for malignant neoplasm of breast: Secondary | ICD-10-CM

## 2019-08-31 NOTE — Telephone Encounter (Signed)
Patient called and stated that [facility] and was suppose to also have a Bone Density done but they told her that the order was not in Epic.  I verified order was there I called [facility] [phone] and spoke with Victorino Dike. She stated that the order was just added to patient's chart and in the workque and that they will be giving the patient a call to schedule the bone density.  Patient notified and agreed.

## 2019-08-31 NOTE — Addendum Note (Signed)
Addended by: Sueanne Margarita on: 08/31/2019 02:44 PM   Modules accepted: Orders

## 2019-09-20 DIAGNOSIS — H6123 Impacted cerumen, bilateral: Secondary | ICD-10-CM | POA: Insufficient documentation

## 2019-10-06 ENCOUNTER — Ambulatory Visit
Admission: RE | Admit: 2019-10-06 | Discharge: 2019-10-06 | Disposition: A | Discharge status: Home / Self Care | Payer: Medicare Other | Source: Ambulatory Visit | Attending: Internal Medicine | Admitting: Internal Medicine

## 2019-10-06 ENCOUNTER — Other Ambulatory Visit: Payer: Self-pay

## 2019-10-19 ENCOUNTER — Telehealth: Payer: Self-pay

## 2019-10-19 MED ORDER — DENOSUMAB 60 MG/ML ~~LOC~~ SOSY: 60.0000 mg | PREFILLED_SYRINGE | SUBCUTANEOUS | 0 refills | Status: AC

## 2019-10-19 NOTE — Telephone Encounter (Signed)
Sent to [place] 

## 2019-10-19 NOTE — Telephone Encounter (Signed)
-----   Message from Lenard Galloway sent at 10/18/2019 11:23 AM EDT ----- Regarding: Baldo Daub with [facility] & she would like for you to call the prolia rx into her pharmacy so that she can see what the cost would be.  If she feels it is toto expensive, she will make her appt for prolia inj at [facility] office.  Jessilyn Catino   DOB: [birth year] Medicare primary  [place] [employer]  Thanks,  Rosezella [place].

## 2019-10-24 DIAGNOSIS — H353211 Exudative age-related macular degeneration, right eye, with active choroidal neovascularization: Secondary | ICD-10-CM | POA: Insufficient documentation

## 2019-10-24 DIAGNOSIS — H3561 Retinal hemorrhage, right eye: Secondary | ICD-10-CM | POA: Insufficient documentation

## 2019-10-24 DIAGNOSIS — H353222 Exudative age-related macular degeneration, left eye, with inactive choroidal neovascularization: Secondary | ICD-10-CM | POA: Insufficient documentation

## 2019-10-24 DIAGNOSIS — H353121 Nonexudative age-related macular degeneration, left eye, early dry stage: Secondary | ICD-10-CM | POA: Insufficient documentation

## 2019-10-24 DIAGNOSIS — H35721 Serous detachment of retinal pigment epithelium, right eye: Secondary | ICD-10-CM | POA: Insufficient documentation

## 2019-10-25 ENCOUNTER — Encounter (INDEPENDENT_AMBULATORY_CARE_PROVIDER_SITE_OTHER): Payer: Medicare Other | Admitting: Ophthalmology

## 2019-10-25 ENCOUNTER — Other Ambulatory Visit: Payer: Self-pay

## 2019-10-25 ENCOUNTER — Ambulatory Visit (INDEPENDENT_AMBULATORY_CARE_PROVIDER_SITE_OTHER): Payer: Medicare Other | Admitting: Ophthalmology

## 2019-10-25 ENCOUNTER — Encounter (INDEPENDENT_AMBULATORY_CARE_PROVIDER_SITE_OTHER): Payer: Self-pay | Admitting: Ophthalmology

## 2019-10-25 DIAGNOSIS — H35721 Serous detachment of retinal pigment epithelium, right eye: Secondary | ICD-10-CM

## 2019-10-25 DIAGNOSIS — H353211 Exudative age-related macular degeneration, right eye, with active choroidal neovascularization: Secondary | ICD-10-CM | POA: Diagnosis not present

## 2019-10-25 DIAGNOSIS — H3561 Retinal hemorrhage, right eye: Secondary | ICD-10-CM

## 2019-10-25 DIAGNOSIS — H353121 Nonexudative age-related macular degeneration, left eye, early dry stage: Secondary | ICD-10-CM

## 2019-10-25 MED ORDER — AFLIBERCEPT 2MG/0.05ML IZ SOLN FOR KALEIDOSCOPE
2.0000 mg | INTRAVITREAL | Status: AC | PRN
Start: 2019-10-25 — End: 2019-10-25
  Administered 2019-10-25: 2 mg via INTRAVITREAL

## 2019-10-25 NOTE — Assessment & Plan Note (Signed)
This component of the lesion complex of wet age-related macular degeneration is improved dramatically since onset of therapy September 2020

## 2019-10-25 NOTE — Assessment & Plan Note (Signed)
The nature of wet macular degeneration was discussed with the patient.  Forms of therapy reviewed include the use of Anti-VEGF medications injected painlessly into the eye, as well as other possible treatment modalities, including thermal laser therapy. Fellow eye involvement and risks were discussed with the patient. Upon the finding of wet age related macular degeneration, treatment will be offered. The treatment regimen is on a treat as needed basis with the intent to treat if necessary and extend interval of exams when possible. On average 1 out of 6 patients do not need lifetime therapy. However, the risk of recurrent disease is high for a lifetime.  Initially monthly, then periodic, examinations and evaluations will determine whether the next treatment is required on the day of the examination.  OD, improved currently on intravitreal Eylea every 6 weeks.  Repeat today and examination in 8 weeks.

## 2019-10-25 NOTE — Progress Notes (Signed)
10/25/2019     CHIEF COMPLAINT Patient presents for Retina Follow Up   HISTORY OF PRESENT ILLNESS: Julia Hinton is a 86 y.o. female who presents to the clinic today for:   HPI    Retina Follow Up    Patient presents with  Wet AMD.  In right eye.  This started 6 weeks ago.  Severity is mild.  Duration of 6 weeks.  Since onset it is stable.          Comments    6 Week AMD F/U OD, poss Eylea OD  Pt sts she might be seeing more floaters OD, like tiny bubbles off and on. Pt denies any other changes to [place] OU.       Last edited by Ileana Roup, COA on 10/25/2019  2:35 PM. (History)      Referring physician: Mahlon Gammon, MD [address] [place],  [place] [address]  HISTORICAL INFORMATION:   Selected notes from the MEDICAL RECORD NUMBER       CURRENT MEDICATIONS: No current outpatient medications on file. (Ophthalmic Drugs)   No current facility-administered medications for this visit. (Ophthalmic Drugs)   Current Outpatient Medications (Other)  Medication Sig  . Cholecalciferol 1000 units tablet Take 1,000 Units by mouth daily.  Marland Kitchen denosumab (PROLIA) 60 MG/ML SOSY injection Inject 60 mg into the skin every 6 (six) months.  . Multiple Vitamins-Calcium (VIACTIV MULTI-VITAMIN) CHEW Chew 2 tablets by mouth daily.   No current facility-administered medications for this visit. (Other)      REVIEW OF SYSTEMS:    ALLERGIES Allergies  Allergen Reactions  . Sulfa Antibiotics     PAST MEDICAL HISTORY Past Medical History:  Diagnosis Date  . Osteoporosis   . PVC (premature ventricular contraction)   . Vitamin D deficiency    Past Surgical History:  Procedure Laterality Date  . TUBAL LIGATION  1969    FAMILY HISTORY Family History  Problem Relation Age of Onset  . Dementia Mother   . Osteoporosis Mother   . Cancer Sister     SOCIAL HISTORY Social History   Tobacco Use  . Smoking status: Never Smoker  . Smokeless tobacco: Never Used  Substance  Use Topics  . Alcohol use: Yes    Alcohol/week: 2.0 standard drinks    Types: 2 Glasses of wine per week  . Drug use: No         OPHTHALMIC EXAM:  Base Eye Exam    Visual Acuity (ETDRS)      Right Left   Dist cc HM 20/20   Dist ph cc NI    Correction: Glasses       Tonometry (Tonopen, 2:40 PM)      Right Left   Pressure 12 14       Pupils      Pupils Dark Light Shape React APD   Right PERRL 4 3 Round Brisk None   Left PERRL 4 3 Round Brisk None       Visual Fields (Counting fingers)      Left Right    Full    Restrictions  Total superior temporal, inferior temporal, superior nasal, inferior nasal deficiencies       Extraocular Movement      Right Left    Full Full       Neuro/Psych    Oriented x3: Yes   Mood/Affect: Normal       Dilation    Right eye: 1.0% Mydriacyl, 2.5% Phenylephrine @  2:40 PM        Slit Lamp and Fundus Exam    External Exam      Right Left   External Normal Normal       Slit Lamp Exam      Right Left   Lids/Lashes Normal Normal   Conjunctiva/Sclera White and quiet White and quiet   Cornea Clear Clear   Anterior Chamber Deep and quiet Deep and quiet   Iris Round and reactive Round and reactive   Lens Posterior chamber intraocular lens Posterior chamber intraocular lens   Anterior Vitreous Normal Normal       Fundus Exam      Right Left   Posterior Vitreous Normal    Disc Normal    C/D Ratio 0.3    Macula Disciform scar,, and submacular area 12 disc areas in days, no active edges currently.    Vessels Normal    Periphery Normal           IMAGING AND PROCEDURES  Imaging and Procedures for 10/25/19  OCT, Retina - OU - Both Eyes       Right Eye Quality was good. Scan locations included subfoveal. Central Foveal Thickness: 583. Progression has improved. Findings include abnormal foveal contour, no SRF, subretinal scarring, choroidal neovascular membrane.   Left Eye Quality was good. Scan locations included  subfoveal. Central Foveal Thickness: 290. Progression has been stable. Findings include retinal drusen , normal observations.   Notes OD massive subretinal, sub-RPE hemorrhage and choroidal neovascular membrane vastly improved on therapy, now with fibrotic scar.  Lesion is overall much smaller.       Intravitreal Injection, Pharmacologic Agent - OD - Right Eye       Time Out 10/25/2019. 3:23 PM. Confirmed correct patient, procedure, site, and patient consented.   Anesthesia Topical anesthesia was used. Anesthetic medications included Akten 3.5%.   Procedure Preparation included Tobramycin 0.3%, 10% betadine to eyelids. A 30 gauge needle was used.   Injection:  2 mg aflibercept Alfonse Flavors) SOLN   NDC: [id number], Lot: [id number]   Route: Intravitreal, Site: Right Eye, Waste: 0 mg  Post-op Post injection exam found visual acuity of at least counting fingers. The patient tolerated the procedure well. There were no complications. The patient received written and verbal post procedure care education. Post injection medications were not given.                 ASSESSMENT/PLAN:  Exudative age-related macular degeneration of right eye with active choroidal neovascularization (HCC) The nature of wet macular degeneration was discussed with the patient.  Forms of therapy reviewed include the use of Anti-VEGF medications injected painlessly into the eye, as well as other possible treatment modalities, including thermal laser therapy. Fellow eye involvement and risks were discussed with the patient. Upon the finding of wet age related macular degeneration, treatment will be offered. The treatment regimen is on a treat as needed basis with the intent to treat if necessary and extend interval of exams when possible. On average 1 out of 6 patients do not need lifetime therapy. However, the risk of recurrent disease is high for a lifetime.  Initially monthly, then periodic, examinations and  evaluations will determine whether the next treatment is required on the day of the examination.  OD, improved currently on intravitreal Eylea every 6 weeks.  Repeat today and examination in 8 weeks.  Serous detachment of retinal pigment epithelium of right eye This component of the lesion complex of wet age-related  macular degeneration is improved dramatically since onset of therapy September 2020      ICD-10-CM   1. Exudative age-related macular degeneration of right eye with active choroidal neovascularization (HCC)  H35.3211 OCT, Retina - OU - Both Eyes    Intravitreal Injection, Pharmacologic Agent - OD - Right Eye    aflibercept (EYLEA) SOLN 2 mg  2. Serous detachment of retinal pigment epithelium of right eye  H35.721    OD, improved currently on intravitreal Eylea every 6 weeks.  Repeat today and examination in 8 weeks. 1.  2.  Goal OD is to limit enlargement of macular scotoma.  In the absence of active lesion margins, will extend examination interval to 8 weeks  3.  Ophthalmic Meds Ordered this visit:  Meds ordered this encounter  Medications  . aflibercept (EYLEA) SOLN 2 mg       Return in about 8 weeks (around 12/20/2019) for dilate, OD, EYLEA OCT.  There are no Patient Instructions on file for this visit.   Explained the diagnoses, plan, and follow up with the patient and they expressed understanding.  Patient expressed understanding of the importance of proper follow up care.   Alford Highland Lougenia Morrissey M.D. Diseases & Surgery of the Retina and Vitreous [facility] 10/25/19     Abbreviations: M myopia (nearsighted); A astigmatism; H hyperopia (farsighted); P presbyopia; Mrx spectacle prescription;  CTL contact lenses; OD right eye; OS left eye; OU both eyes  XT exotropia; ET esotropia; PEK punctate epithelial keratitis; PEE punctate epithelial erosions; DES dry eye syndrome; MGD meibomian gland dysfunction; ATs artificial tears; PFAT's preservative free  artificial tears; NSC nuclear sclerotic cataract; PSC posterior subcapsular cataract; ERM epi-retinal membrane; PVD posterior vitreous detachment; RD retinal detachment; DM diabetes mellitus; DR diabetic retinopathy; NPDR non-proliferative diabetic retinopathy; PDR proliferative diabetic retinopathy; CSME clinically significant macular edema; DME diabetic macular edema; dbh dot blot hemorrhages; CWS cotton wool spot; POAG primary open angle glaucoma; C/D cup-to-disc ratio; HVF humphrey visual field; GVF goldmann visual field; OCT optical coherence tomography; IOP intraocular pressure; BRVO Branch retinal vein occlusion; CRVO central retinal vein occlusion; CRAO central retinal artery occlusion; BRAO branch retinal artery occlusion; RT retinal tear; SB scleral buckle; PPV pars plana vitrectomy; VH Vitreous hemorrhage; PRP panretinal laser photocoagulation; IVK intravitreal kenalog; VMT vitreomacular traction; MH Macular hole;  NVD neovascularization of the disc; NVE neovascularization elsewhere; AREDS age related eye disease study; ARMD age related macular degeneration; POAG primary open angle glaucoma; EBMD epithelial/anterior basement membrane dystrophy; ACIOL anterior chamber intraocular lens; IOL intraocular lens; PCIOL posterior chamber intraocular lens; Phaco/IOL phacoemulsification with intraocular lens placement; PRK photorefractive keratectomy; LASIK laser assisted in situ keratomileusis; HTN hypertension; DM diabetes mellitus; COPD chronic obstructive pulmonary disease

## 2019-11-08 ENCOUNTER — Ambulatory Visit (INDEPENDENT_AMBULATORY_CARE_PROVIDER_SITE_OTHER): Payer: Medicare Other

## 2019-11-08 ENCOUNTER — Other Ambulatory Visit: Payer: Self-pay

## 2019-11-08 DIAGNOSIS — M810 Age-related osteoporosis without current pathological fracture: Secondary | ICD-10-CM

## 2019-11-08 MED ORDER — DENOSUMAB 60 MG/ML ~~LOC~~ SOSY
60.0000 mg | PREFILLED_SYRINGE | Freq: Once | SUBCUTANEOUS | Status: AC
  Administered 2019-11-08: 60 mg via SUBCUTANEOUS

## 2019-12-20 ENCOUNTER — Other Ambulatory Visit: Payer: Self-pay

## 2019-12-20 ENCOUNTER — Encounter (INDEPENDENT_AMBULATORY_CARE_PROVIDER_SITE_OTHER): Payer: Self-pay | Admitting: Ophthalmology

## 2019-12-20 ENCOUNTER — Ambulatory Visit (INDEPENDENT_AMBULATORY_CARE_PROVIDER_SITE_OTHER): Payer: Medicare Other | Admitting: Ophthalmology

## 2019-12-20 DIAGNOSIS — H353211 Exudative age-related macular degeneration, right eye, with active choroidal neovascularization: Secondary | ICD-10-CM | POA: Diagnosis not present

## 2019-12-20 MED ORDER — AFLIBERCEPT 2MG/0.05ML IZ SOLN FOR KALEIDOSCOPE
2.0000 mg | INTRAVITREAL | Status: AC | PRN
  Administered 2019-12-20: 2 mg via INTRAVITREAL

## 2019-12-20 NOTE — Assessment & Plan Note (Signed)
OD vastly improved massive subfoveal hemorrhage, CNVM, at 8-week interval on Eylea.  We will repeat injection today to maintain and follow-up visit OD in 3 months

## 2019-12-20 NOTE — Progress Notes (Signed)
12/20/2019     CHIEF COMPLAINT Patient presents for Retina Follow Up   HISTORY OF PRESENT ILLNESS: Julia Hinton is a 86 y.o. female who presents to the clinic today for:   HPI    Retina Follow Up    Patient presents with  Wet AMD.  In right eye.  Severity is moderate.  Duration of 8 weeks.  Since onset it is stable.  I, the attending physician,  performed the HPI with the patient and updated documentation appropriately.          Comments    8 Week AMD f\u OD. Possible Eylea OD. OC  Pt states no changes in vision. Denies any complaints       Last edited by Elyse Jarvis on 12/20/2019  2:46 PM. (History)      Referring physician: Mahlon Gammon, MD [address] [place],  [place] [address]  HISTORICAL INFORMATION:   Selected notes from the MEDICAL RECORD NUMBER       CURRENT MEDICATIONS: No current outpatient medications on file. (Ophthalmic Drugs)   No current facility-administered medications for this visit. (Ophthalmic Drugs)   Current Outpatient Medications (Other)  Medication Sig  . Cholecalciferol 1000 units tablet Take 1,000 Units by mouth daily.  Marland Kitchen denosumab (PROLIA) 60 MG/ML SOSY injection Inject 60 mg into the skin every 6 (six) months.  . Multiple Vitamins-Calcium (VIACTIV MULTI-VITAMIN) CHEW Chew 2 tablets by mouth daily.   No current facility-administered medications for this visit. (Other)      REVIEW OF SYSTEMS:    ALLERGIES Allergies  Allergen Reactions  . Sulfa Antibiotics     PAST MEDICAL HISTORY Past Medical History:  Diagnosis Date  . Osteoporosis   . PVC (premature ventricular contraction)   . Vitamin D deficiency    Past Surgical History:  Procedure Laterality Date  . TUBAL LIGATION  1969    FAMILY HISTORY Family History  Problem Relation Age of Onset  . Dementia Mother   . Osteoporosis Mother   . Cancer Sister     SOCIAL HISTORY Social History   Tobacco Use  . Smoking status: Never Smoker  . Smokeless  tobacco: Never Used  Vaping Use  . Vaping Use: Never used  Substance Use Topics  . Alcohol use: Yes    Alcohol/week: 2.0 standard drinks    Types: 2 Glasses of wine per week  . Drug use: No         OPHTHALMIC EXAM:  Base Eye Exam    Visual Acuity (Snellen - Linear)      Right Left   Dist cc HM 20/20 -2       Tonometry (Tonopen, 2:50 PM)      Right Left   Pressure 12 12       Pupils      Pupils Dark Light Shape React APD   Right PERRL 4 3 Round Brisk None   Left PERRL 4 3 Round Brisk None       Visual Fields (Counting fingers)      Left Right    Full    Restrictions  Total superior temporal, inferior temporal, superior nasal, inferior nasal deficiencies       Neuro/Psych    Oriented x3: Yes   Mood/Affect: Normal       Dilation    Right eye: 1.0% Mydriacyl, 2.5% Phenylephrine @ 2:50 PM        Slit Lamp and Fundus Exam    External Exam  Right Left   External Normal Normal       Slit Lamp Exam      Right Left   Lids/Lashes Normal Normal   Conjunctiva/Sclera White and quiet White and quiet   Cornea Clear Clear   Anterior Chamber Deep and quiet Deep and quiet   Iris Round and reactive Round and reactive   Lens Posterior chamber intraocular lens Posterior chamber intraocular lens   Anterior Vitreous Normal Normal       Fundus Exam      Right Left   Posterior Vitreous Normal    Disc Normal    C/D Ratio 0.3    Macula Disciform scar white fibrotic,, and submacular area 12 disc areas in size no active edges currently.    Vessels Normal    Periphery Normal           IMAGING AND PROCEDURES  Imaging and Procedures for 12/20/19  OCT, Retina - OU - Both Eyes       Right Eye Findings include abnormal foveal contour, no IRF, no SRF, disciform scar.   Left Eye Quality was good. Scan locations included subfoveal. Central Foveal Thickness: 293. Progression has been stable. Findings include retinal drusen , no IRF, no SRF.   Notes OD with  massive subfoveal disciform scar yet with no active edges, and much less subretinal hemorrhage, much less intraretinal fluid       Intravitreal Injection, Pharmacologic Agent - OD - Right Eye       Time Out 12/20/2019. 3:43 PM. Confirmed correct patient, procedure, site, and patient consented.   Anesthesia Topical anesthesia was used. Anesthetic medications included Akten 3.5%.   Procedure Preparation included Ofloxacin , 10% betadine to eyelids. A 30 gauge needle was used.   Injection:  2 mg aflibercept Gretta Cool) SOLN   NDC: L6038910, Lot: [id number]   Route: Intravitreal, Site: Right Eye, Waste: 0 mg  Post-op Post injection exam found visual acuity of at least counting fingers. The patient tolerated the procedure well. There were no complications. The patient received written and verbal post procedure care education. Post injection medications were not given.                 ASSESSMENT/PLAN:  Exudative age-related macular degeneration of right eye with active choroidal neovascularization (HCC) OD vastly improved massive subfoveal hemorrhage, CNVM, at 8-week interval on Eylea.  We will repeat injection today to maintain and follow-up visit OD in 3 months      ICD-10-CM   1. Exudative age-related macular degeneration of right eye with active choroidal neovascularization (HCC)  H35.3211 OCT, Retina - OU - Both Eyes    Intravitreal Injection, Pharmacologic Agent - OD - Right Eye    aflibercept (EYLEA) SOLN 2 mg    1.  2.  3.  Ophthalmic Meds Ordered this visit:  Meds ordered this encounter  Medications  . aflibercept (EYLEA) SOLN 2 mg       Return in about 3 months (around 03/21/2020) for dilate, EYLEA OCT, OD.  There are no Patient Instructions on file for this visit.   Explained the diagnoses, plan, and follow up with the patient and they expressed understanding.  Patient expressed understanding of the importance of proper follow up care.   Alford Highland  Jawan Chavarria M.D. Diseases & Surgery of the Retina and Vitreous [facility] 12/20/19     Abbreviations: M myopia (nearsighted); A astigmatism; H hyperopia (farsighted); P presbyopia; Mrx spectacle prescription;  CTL contact lenses; OD  right eye; OS left eye; OU both eyes  XT exotropia; ET esotropia; PEK punctate epithelial keratitis; PEE punctate epithelial erosions; DES dry eye syndrome; MGD meibomian gland dysfunction; ATs artificial tears; PFAT's preservative free artificial tears; NSC nuclear sclerotic cataract; PSC posterior subcapsular cataract; ERM epi-retinal membrane; PVD posterior vitreous detachment; RD retinal detachment; DM diabetes mellitus; DR diabetic retinopathy; NPDR non-proliferative diabetic retinopathy; PDR proliferative diabetic retinopathy; CSME clinically significant macular edema; DME diabetic macular edema; dbh dot blot hemorrhages; CWS cotton wool spot; POAG primary open angle glaucoma; C/D cup-to-disc ratio; HVF humphrey visual field; GVF goldmann visual field; OCT optical coherence tomography; IOP intraocular pressure; BRVO Branch retinal vein occlusion; CRVO central retinal vein occlusion; CRAO central retinal artery occlusion; BRAO branch retinal artery occlusion; RT retinal tear; SB scleral buckle; PPV pars plana vitrectomy; VH Vitreous hemorrhage; PRP panretinal laser photocoagulation; IVK intravitreal kenalog; VMT vitreomacular traction; MH Macular hole;  NVD neovascularization of the disc; NVE neovascularization elsewhere; AREDS age related eye disease study; ARMD age related macular degeneration; POAG primary open angle glaucoma; EBMD epithelial/anterior basement membrane dystrophy; ACIOL anterior chamber intraocular lens; IOL intraocular lens; PCIOL posterior chamber intraocular lens; Phaco/IOL phacoemulsification with intraocular lens placement; PRK photorefractive keratectomy; LASIK laser assisted in situ keratomileusis; HTN hypertension; DM diabetes  mellitus; COPD chronic obstructive pulmonary disease

## 2019-12-28 ENCOUNTER — Encounter: Payer: Medicare Other | Admitting: Internal Medicine

## 2020-01-04 ENCOUNTER — Encounter: Payer: Self-pay | Admitting: Internal Medicine

## 2020-01-04 ENCOUNTER — Non-Acute Institutional Stay: Payer: Medicare Other | Admitting: Internal Medicine

## 2020-01-04 ENCOUNTER — Other Ambulatory Visit: Payer: Self-pay

## 2020-01-04 VITALS — BP 122/68 | HR 81 | Temp 98.3°F | Ht 59.0 in | Wt 100.5 lb

## 2020-01-04 DIAGNOSIS — M810 Age-related osteoporosis without current pathological fracture: Secondary | ICD-10-CM

## 2020-01-04 DIAGNOSIS — I493 Ventricular premature depolarization: Secondary | ICD-10-CM

## 2020-01-04 DIAGNOSIS — E559 Vitamin D deficiency, unspecified: Secondary | ICD-10-CM

## 2020-01-04 DIAGNOSIS — E785 Hyperlipidemia, unspecified: Secondary | ICD-10-CM

## 2020-01-04 NOTE — Progress Notes (Addendum)
Location:  [facility] Service:  Clinic (12)  Provider:   Code Status:  Goals of Care:  Advanced Directives 08/19/2017  Does Patient Have a Medical Advance Directive? Yes  Type of Estate agent of [place];Living will  Does patient want to make changes to medical advance directive? No - Patient declined  Copy of Healthcare Power of Attorney in Chart? Yes     Chief Complaint  Patient presents with  . Medical Management of Chronic Issues    Patient returns to the clinic for 4 month follow up  . Health Maintenance    PNA    HPI: Patient is a 86 y.o. female seen today for medical management of chronic diseases.   Patient has history Osteoporosis and Right Eye Macular degeneration and Vit D Def  Osteoporosis Recently started on Prolia Was on Fosamax before Recent DEXA in 4/21 was Tscore -2.8 Did well with Prolia Right Eye Macular Degeneration Follows with Dr Luciana Axe  Lives with her husband. Independent in her ADLS and IADLS. Walks with no assist. No Falls Has daughter in [place]  Past Medical History:  Diagnosis Date  . Osteoporosis   . PVC (premature ventricular contraction)   . Vitamin D deficiency     Past Surgical History:  Procedure Laterality Date  . TUBAL LIGATION  1969    Allergies  Allergen Reactions  . Sulfa Antibiotics     Outpatient Encounter Medications as of 01/04/2020  Medication Sig  . Cholecalciferol 1000 units tablet Take 1,000 Units by mouth daily.  Marland Kitchen denosumab (PROLIA) 60 MG/ML SOSY injection Inject 60 mg into the skin every 6 (six) months.  . Multiple Vitamins-Calcium (VIACTIV MULTI-VITAMIN) CHEW Chew 2 tablets by mouth daily.   No facility-administered encounter medications on file as of 01/04/2020.    Review of Systems:  Review of Systems  Review of Systems  Constitutional: Negative for activity change, appetite change, chills, diaphoresis, fatigue and fever.  HENT: Negative for mouth  sores, postnasal drip, rhinorrhea, sinus pain and sore throat.   Respiratory: Negative for apnea, cough, chest tightness, shortness of breath and wheezing.   Cardiovascular: Negative for chest pain, palpitations and leg swelling.  Gastrointestinal: Negative for abdominal distention, abdominal pain, constipation, diarrhea, nausea and vomiting.  Genitourinary: Negative for dysuria and frequency.  Musculoskeletal: Negative for arthralgias, joint swelling and myalgias.  Skin: Negative for rash.  Neurological: Negative for dizziness, syncope, weakness, light-headedness and numbness.  Psychiatric/Behavioral: Negative for behavioral problems, confusion and sleep disturbance.     Health Maintenance  Topic Date Due  . PNA vac Low Risk Adult (1 of 2 - PCV13) Never done  . INFLUENZA VACCINE  01/22/2020  . TETANUS/TDAP  02/15/2024  . DEXA SCAN  Completed  . COVID-19 Vaccine  Completed    Physical Exam: Vitals:   01/04/20 1408  BP: 122/68  Pulse: 81  Temp: 98.3 F (36.8 C)  SpO2: 94%  Weight: 100 lb 8 oz (45.6 kg)  Height: 4\' 11"  (1.499 m)   Body mass index is 20.3 kg/m. Physical Exam  Constitutional: Oriented to person, place, and time. Well-developed and well-nourished.  HENT:  Head: Normocephalic.  Mouth/Throat: Oropharynx is clear and moist.  Eyes: Pupils are equal, round, and reactive to light.  Neck: Neck supple.  Cardiovascular: Normal rate and normal heart sounds.  No murmur heard. Pulmonary/Chest: Effort normal and breath sounds normal. No respiratory distress. No wheezes. She has no rales.  Abdominal: Soft. Bowel sounds are normal.  No distension. There is no tenderness. There is no rebound.  Musculoskeletal: No edema.  Lymphadenopathy: none Neurological: Alert and oriented to person, place, and time.  Gait stable Skin: Skin is warm and dry.  Psychiatric: Normal mood and affect. Behavior is normal. Thought content normal.   Labs reviewed: Basic Metabolic  Panel: Recent Labs    08/26/19 1010  NA 141  K 4.3  CL 103  CO2 31  GLUCOSE 80  BUN 16  CREATININE 0.82  CALCIUM 9.6  TSH 2.61   Liver Function Tests: Recent Labs    08/26/19 1010  AST 19  ALT 13  BILITOT 0.5  PROT 6.6   No results for input(s): LIPASE, AMYLASE in the last 8760 hours. No results for input(s): AMMONIA in the last 8760 hours. CBC: Recent Labs    08/26/19 1010  WBC 6.7  NEUTROABS 3,886  HGB 14.5  HCT 44.2  MCV 91.3  PLT 254   Lipid Panel: Recent Labs    08/26/19 1010  CHOL 182  HDL 66  LDLCALC 96  TRIG 108  CHOLHDL 2.8   No results found for: HGBA1C  Procedures since last visit: Intravitreal Injection, Pharmacologic Agent - OD - Right Eye  Result Date: 12/20/2019 Time Out 12/20/2019. 3:43 PM. Confirmed correct patient, procedure, site, and patient consented. Anesthesia Topical anesthesia was used. Anesthetic medications included Akten 3.5%. Procedure Preparation included Ofloxacin , 10% betadine to eyelids. A 30 gauge needle was used. Injection: 2 mg aflibercept Gretta Cool) SOLN   NDC: L6038910, Lot: [id number]   Route: Intravitreal, Site: Right Eye, Waste: 0 mg Post-op Post injection exam found visual acuity of at least counting fingers. The patient tolerated the procedure well. There were no complications. The patient received written and verbal post procedure care education. Post injection medications were not given.   OCT, Retina - OU - Both Eyes  Result Date: 12/20/2019 Right Eye Findings include abnormal foveal contour, no IRF, no SRF, disciform scar. Left Eye Quality was good. Scan locations included subfoveal. Central Foveal Thickness: 293. Progression has been stable. Findings include retinal drusen , no IRF, no SRF. Notes OD with massive subfoveal disciform scar yet with no active edges, and much less subretinal hemorrhage, much less intraretinal fluid   Assessment/Plan Osteoporosis, unspecified osteoporosis type, unspecified  pathological fracture presence DEXA Scan 2021 T Score -2.8 On Prolia Now H/O Asymptomatic PVCs Labs are Normal Vitamin D deficiency Vit D Normal On Supplement Hyperlipidemia, unspecified hyperlipidemia type LDL Less then 100 Family History of breast cancer Her sister Gets Mammogram Q yearly  Say she is upto date with her Vaccination Will bring her record next visit   Labs/tests ordered:  * No order type specified * Next appt:  05/11/2020

## 2020-03-20 ENCOUNTER — Encounter (INDEPENDENT_AMBULATORY_CARE_PROVIDER_SITE_OTHER): Payer: Self-pay | Admitting: Ophthalmology

## 2020-03-20 ENCOUNTER — Other Ambulatory Visit: Payer: Self-pay

## 2020-03-20 ENCOUNTER — Ambulatory Visit (INDEPENDENT_AMBULATORY_CARE_PROVIDER_SITE_OTHER): Payer: Medicare Other | Admitting: Ophthalmology

## 2020-03-20 DIAGNOSIS — H353121 Nonexudative age-related macular degeneration, left eye, early dry stage: Secondary | ICD-10-CM | POA: Diagnosis not present

## 2020-03-20 DIAGNOSIS — H353211 Exudative age-related macular degeneration, right eye, with active choroidal neovascularization: Secondary | ICD-10-CM

## 2020-03-20 MED ORDER — AFLIBERCEPT 2MG/0.05ML IZ SOLN FOR KALEIDOSCOPE
2.0000 mg | INTRAVITREAL | Status: AC | PRN
  Administered 2020-03-20: 2 mg via INTRAVITREAL

## 2020-03-20 NOTE — Assessment & Plan Note (Signed)

## 2020-03-20 NOTE — Patient Instructions (Signed)
Promptly new onset visual acuity distortions or decline in either eye

## 2020-03-20 NOTE — Assessment & Plan Note (Signed)
Large subfoveal fibrotic disciform scar macula right eye, still active as noted by OCT findings of intraretinal fluid.  We will repeat injection intravitreal Eylea today at 29-month interval examination again in 3 months OU

## 2020-03-20 NOTE — Progress Notes (Signed)
03/20/2020     CHIEF COMPLAINT Patient presents for Retina Follow Up   HISTORY OF PRESENT ILLNESS: Julia Hinton is a 86 y.o. female who presents to the clinic today for:   HPI    Retina Follow Up    Patient presents with  Wet AMD.  In right eye.  Severity is moderate.  Duration of 3 months.  Since onset it is stable.  I, the attending physician,  performed the HPI with the patient and updated documentation appropriately.          Comments    3 Month Wet AMD f\u OD. Possible Eylea OD. OCT  Pt states vision has been stable. Denies any complaints.       Last edited by Elyse Jarvis on 03/20/2020  2:14 PM. (History)      Referring physician: Mahlon Gammon, MD [address] [place],  [place] [address]  HISTORICAL INFORMATION:   Selected notes from the MEDICAL RECORD NUMBER       CURRENT MEDICATIONS: No current outpatient medications on file. (Ophthalmic Drugs)   No current facility-administered medications for this visit. (Ophthalmic Drugs)   Current Outpatient Medications (Other)  Medication Sig  . Cholecalciferol 1000 units tablet Take 1,000 Units by mouth daily.  Marland Kitchen denosumab (PROLIA) 60 MG/ML SOSY injection Inject 60 mg into the skin every 6 (six) months.  . Multiple Vitamins-Calcium (VIACTIV MULTI-VITAMIN) CHEW Chew 2 tablets by mouth daily.   No current facility-administered medications for this visit. (Other)      REVIEW OF SYSTEMS:    ALLERGIES Allergies  Allergen Reactions  . Sulfa Antibiotics     PAST MEDICAL HISTORY Past Medical History:  Diagnosis Date  . Osteoporosis   . PVC (premature ventricular contraction)   . Vitamin D deficiency    Past Surgical History:  Procedure Laterality Date  . TUBAL LIGATION  1969    FAMILY HISTORY Family History  Problem Relation Age of Onset  . Dementia Mother   . Osteoporosis Mother   . Cancer Sister     SOCIAL HISTORY Social History   Tobacco Use  . Smoking status: Never Smoker  .  Smokeless tobacco: Never Used  Vaping Use  . Vaping Use: Never used  Substance Use Topics  . Alcohol use: Yes    Alcohol/week: 2.0 standard drinks    Types: 2 Glasses of wine per week  . Drug use: No         OPHTHALMIC EXAM:  Base Eye Exam    Visual Acuity (Snellen - Linear)      Right Left   Dist cc HM 20/20       Tonometry (Tonopen, 2:17 PM)      Right Left   Pressure 17 14       Pupils      Pupils Dark Light Shape React APD   Right PERRL 4 3 Round Brisk None   Left PERRL 4 3 Round Brisk None       Visual Fields      Left Right   Restrictions  Total superior temporal, inferior temporal, superior nasal, inferior nasal deficiencies       Neuro/Psych    Oriented x3: Yes   Mood/Affect: Normal       Dilation    Right eye: 1.0% Mydriacyl, 2.5% Phenylephrine @ 2:18 PM        Slit Lamp and Fundus Exam    External Exam      Right Left  External Normal Normal       Slit Lamp Exam      Right Left   Lids/Lashes Normal Normal   Conjunctiva/Sclera White and quiet White and quiet   Cornea Clear Clear   Anterior Chamber Deep and quiet Deep and quiet   Iris Round and reactive Round and reactive   Lens Posterior chamber intraocular lens Posterior chamber intraocular lens   Anterior Vitreous Normal Normal       Fundus Exam      Right Left   Posterior Vitreous Normal    Disc Normal    C/D Ratio 0.3    Macula Disciform scar white fibrotic,, and submacular area 12 disc areas in size no active edges currently.    Vessels Normal    Periphery Normal           IMAGING AND PROCEDURES  Imaging and Procedures for 03/20/20  OCT, Retina - OU - Both Eyes       Right Eye Quality was good. Scan locations included subfoveal. Central Foveal Thickness: [id number]. Findings include intraretinal fluid, disciform scar.   Left Eye Quality was good. Scan locations included subfoveal. Central Foveal Thickness: 287. Progression has been stable.        Intravitreal  Injection, Pharmacologic Agent - OD - Right Eye       Time Out 03/20/2020. 2:58 PM. Confirmed correct patient, procedure, site, and patient consented.   Anesthesia Topical anesthesia was used. Anesthetic medications included Akten 3.5%.   Procedure Preparation included Ofloxacin , 10% betadine to eyelids, 5% betadine to ocular surface, Tobramycin 0.3%. A 30 gauge needle was used.   Injection:  2 mg aflibercept Gretta Cool) SOLN   NDC: L6038910, Lot: [id number]   Route: Intravitreal, Site: Right Eye, Waste: 0 mg  Post-op Post injection exam found visual acuity of at least counting fingers. The patient tolerated the procedure well. There were no complications. The patient received written and verbal post procedure care education. Post injection medications were not given.                 ASSESSMENT/PLAN:  Exudative age-related macular degeneration of right eye with active choroidal neovascularization (HCC) Large subfoveal fibrotic disciform scar macula right eye, still active as noted by OCT findings of intraretinal fluid.  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 Checking an Amsler Grid daily with advice to return immediately should a distortion develop, was given to the patient. The patient 's smoking status now and in the past was determined and advice based on the AREDS study was provided regarding the consumption of antioxidant supplements. AREDS 2 vitamin formulation was recommended. Consumption of dark leafy vegetables and fresh fruits of various colors was recommended. Treatment modalities for wet macular degeneration particularly the use of intravitreal injections of anti-blood vessel growth factors was discussed  with the patient. Avastin, Lucentis, and Eylea are the available options. On occasion, therapy includes the use of photodynamic therapy and thermal laser. Stressed to the patient do not rub eyes.  Patient was advised to check Amsler Grid daily and return immediately if changes are noted. Instructions on using the grid were given to the patient. All patient questions were answered.      ICD-10-CM   1. Exudative age-related macular degeneration of right  eye with active choroidal neovascularization (HCC)  H35.3211 OCT, Retina - OU - Both Eyes    Intravitreal Injection, Pharmacologic Agent - OD - Right Eye    aflibercept (EYLEA) SOLN 2 mg  2. 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  Repeat intravitreal Eylea OD today to prevent scotoma enlargement  2.  Dilate OU next visit  3.  Ophthalmic Meds Ordered this visit:  Meds ordered this encounter  Medications  . aflibercept (EYLEA) SOLN 2 mg       Return in about 3 months (around 06/19/2020) for DILATE OU, EYLEA OCT, OD.  Patient Instructions  Promptly new onset visual acuity distortions or decline in either eye    Explained the diagnoses, plan, and follow up with the patient and they expressed understanding.  Patient expressed understanding of the importance of proper follow up care.   Alford Highland Dynasty Holquin M.D. Diseases & Surgery of the Retina and Vitreous [facility] 03/20/20     Abbreviations: M myopia (nearsighted); A astigmatism; H hyperopia (farsighted); P presbyopia; Mrx spectacle prescription;  CTL contact lenses; OD right eye; OS left eye; OU both eyes  XT exotropia; ET esotropia; PEK punctate epithelial keratitis; PEE punctate epithelial erosions; DES dry eye syndrome; MGD meibomian gland dysfunction; ATs artificial tears; PFAT's preservative free artificial tears; NSC nuclear sclerotic cataract; PSC posterior subcapsular cataract; ERM epi-retinal membrane; PVD  posterior vitreous detachment; RD retinal detachment; DM diabetes mellitus; DR diabetic retinopathy; NPDR non-proliferative diabetic retinopathy; PDR proliferative diabetic retinopathy; CSME clinically significant macular edema; DME diabetic macular edema; dbh dot blot hemorrhages; CWS cotton wool spot; POAG primary open angle glaucoma; C/D cup-to-disc ratio; HVF humphrey visual field; GVF goldmann visual field; OCT optical coherence tomography; IOP intraocular pressure; BRVO Branch retinal vein occlusion; CRVO central retinal vein occlusion; CRAO central retinal artery occlusion; BRAO branch retinal artery occlusion; RT retinal tear; SB scleral buckle; PPV pars plana vitrectomy; VH Vitreous hemorrhage; PRP panretinal laser photocoagulation; IVK intravitreal kenalog; VMT vitreomacular traction; MH Macular hole;  NVD neovascularization of the disc; NVE neovascularization elsewhere; AREDS age related eye disease study; ARMD age related macular degeneration; POAG primary open angle glaucoma; EBMD epithelial/anterior basement membrane dystrophy; ACIOL anterior chamber intraocular lens; IOL intraocular lens; PCIOL posterior chamber intraocular lens; Phaco/IOL phacoemulsification with intraocular lens placement; PRK photorefractive keratectomy; LASIK laser assisted in situ keratomileusis; HTN hypertension; DM diabetes mellitus; COPD chronic obstructive pulmonary disease

## 2020-05-10 ENCOUNTER — Telehealth: Payer: Self-pay

## 2020-05-10 NOTE — Telephone Encounter (Signed)
Lets change it to Next week

## 2020-05-10 NOTE — Telephone Encounter (Signed)
Incoming call received from patient stating she received her covid booster injection on Monday and experienced a low-grade fever, headache, achy, and overall felt sick x 2 days. Patient is felling better today and questions if she should get prolia injection tomorrow as schedule or put it off a week or two.  Please advise

## 2020-05-10 NOTE — Telephone Encounter (Signed)
Spoke with patient and rescheduled to 05/22/2020. Patient was unable to schedule for next week due to the holiday.

## 2020-05-11 ENCOUNTER — Ambulatory Visit: Payer: Medicare Other

## 2020-05-22 ENCOUNTER — Other Ambulatory Visit: Payer: Self-pay

## 2020-05-22 ENCOUNTER — Encounter (INDEPENDENT_AMBULATORY_CARE_PROVIDER_SITE_OTHER): Payer: Medicare Other

## 2020-05-22 DIAGNOSIS — M810 Age-related osteoporosis without current pathological fracture: Secondary | ICD-10-CM | POA: Diagnosis not present

## 2020-05-22 MED ORDER — DENOSUMAB 60 MG/ML ~~LOC~~ SOSY
60.0000 mg | PREFILLED_SYRINGE | Freq: Once | SUBCUTANEOUS | Status: AC
  Administered 2020-05-22: 60 mg via SUBCUTANEOUS

## 2020-05-29 ENCOUNTER — Encounter: Payer: Self-pay | Admitting: Internal Medicine

## 2020-06-19 ENCOUNTER — Encounter (INDEPENDENT_AMBULATORY_CARE_PROVIDER_SITE_OTHER): Payer: Medicare Other | Admitting: Ophthalmology

## 2020-06-25 ENCOUNTER — Ambulatory Visit (INDEPENDENT_AMBULATORY_CARE_PROVIDER_SITE_OTHER): Payer: Medicare Other | Admitting: Ophthalmology

## 2020-06-25 ENCOUNTER — Encounter (INDEPENDENT_AMBULATORY_CARE_PROVIDER_SITE_OTHER): Payer: Self-pay | Admitting: Ophthalmology

## 2020-06-25 ENCOUNTER — Other Ambulatory Visit: Payer: Self-pay

## 2020-06-25 DIAGNOSIS — H353121 Nonexudative age-related macular degeneration, left eye, early dry stage: Secondary | ICD-10-CM | POA: Diagnosis not present

## 2020-06-25 DIAGNOSIS — H35721 Serous detachment of retinal pigment epithelium, right eye: Secondary | ICD-10-CM | POA: Diagnosis not present

## 2020-06-25 DIAGNOSIS — H353211 Exudative age-related macular degeneration, right eye, with active choroidal neovascularization: Secondary | ICD-10-CM

## 2020-06-25 MED ORDER — AFLIBERCEPT 2MG/0.05ML IZ SOLN FOR KALEIDOSCOPE
2.0000 mg | INTRAVITREAL | Status: AC | PRN
  Administered 2020-06-25: 2 mg via INTRAVITREAL

## 2020-06-25 NOTE — Progress Notes (Addendum)
06/25/2020     CHIEF COMPLAINT Patient presents for Retina Follow Up (3 Month f\u OU. Possible Eylea OD. OCT/Pt states vision is stable. Pt states a few weeks ago she seen more floaters in OS than usual. )   HISTORY OF PRESENT ILLNESS: Julia Hinton is a 87 y.o. female who presents to the clinic today for:   HPI    Retina Follow Up    Patient presents with  Wet AMD.  In right eye.  Severity is moderate.  Duration of 3 months.  Since onset it is stable.  I, the attending physician,  performed the HPI with the patient and updated documentation appropriately. Additional comments: 3 Month f\u OU. Possible Eylea OD. OCT Pt states vision is stable. Pt states a few weeks ago she seen more floaters in OS than usual.        Last edited by Elyse Jarvis on 06/25/2020  2:53 PM. (History)      Referring physician: Mahlon Gammon, MD [address] [place],  [place] [address]  HISTORICAL INFORMATION:   Selected notes from the MEDICAL RECORD NUMBER       CURRENT MEDICATIONS: No current outpatient medications on file. (Ophthalmic Drugs)   No current facility-administered medications for this visit. (Ophthalmic Drugs)   Current Outpatient Medications (Other)  Medication Sig  . Cholecalciferol 1000 units tablet Take 1,000 Units by mouth daily.  Marland Kitchen denosumab (PROLIA) 60 MG/ML SOSY injection Inject 60 mg into the skin every 6 (six) months.  . Multiple Vitamins-Calcium (VIACTIV MULTI-VITAMIN) CHEW Chew 2 tablets by mouth daily.   No current facility-administered medications for this visit. (Other)      REVIEW OF SYSTEMS:    ALLERGIES Allergies  Allergen Reactions  . Sulfa Antibiotics     PAST MEDICAL HISTORY Past Medical History:  Diagnosis Date  . Osteoporosis   . PVC (premature ventricular contraction)   . Vitamin D deficiency    Past Surgical History:  Procedure Laterality Date  . TUBAL LIGATION  1969    FAMILY HISTORY Family History  Problem Relation Age of  Onset  . Dementia Mother   . Osteoporosis Mother   . Cancer Sister     SOCIAL HISTORY Social History   Tobacco Use  . Smoking status: Never Smoker  . Smokeless tobacco: Never Used  Vaping Use  . Vaping Use: Never used  Substance Use Topics  . Alcohol use: Yes    Alcohol/week: 2.0 standard drinks    Types: 2 Glasses of wine per week  . Drug use: No         OPHTHALMIC EXAM:  Base Eye Exam    Visual Acuity (Snellen - Linear)      Right Left   Dist cc HM 20/20       Tonometry (Tonopen, 2:57 PM)      Right Left   Pressure 16 16       Pupils      Pupils Dark Light Shape React APD   Right PERRL 4 3 Round Brisk None   Left PERRL 4 3 Round Brisk None       Visual Fields (Counting fingers)      Left Right    Full    Restrictions  Total superior temporal, inferior temporal, superior nasal, inferior nasal deficiencies       Neuro/Psych    Oriented x3: Yes   Mood/Affect: Normal       Dilation    Both eyes: 1.0% Mydriacyl,  2.5% Phenylephrine @ 2:58 PM        Slit Lamp and Fundus Exam    External Exam      Right Left   External Normal Normal       Slit Lamp Exam      Right Left   Lids/Lashes Normal Normal   Conjunctiva/Sclera White and quiet White and quiet   Cornea Clear Clear   Anterior Chamber Deep and quiet Deep and quiet   Iris Round and reactive Round and reactive   Lens Posterior chamber intraocular lens Posterior chamber intraocular lens   Anterior Vitreous Normal Normal       Fundus Exam      Right Left   Posterior Vitreous Posterior vitreous detachment Posterior vitreous detachment   Disc Normal Normal   C/D Ratio 0.3 0.5   Macula Disciform scar white fibrotic,, and submacular area 7 disc areas in size no active edges currently. Intermediate age related macular degeneration, no macular thickening, no hemorrhage, no exudates   Vessels Normal Normal   Periphery Normal Normal          IMAGING AND PROCEDURES  Imaging and Procedures for  06/25/20  OCT, Retina - OU - Both Eyes       Right Eye Quality was good. Scan locations included subfoveal. Central Foveal Thickness: 607. Findings include no IRF, no SRF, disciform scar.   Left Eye Quality was good. Scan locations included subfoveal. Central Foveal Thickness: 295. Findings include abnormal foveal contour, no IRF, no SRF.   Notes OD, subfoveal disciform scar now one third the size of incident event with massive subretinal hemorrhage CNVM from mid 2020.  No signs of active lesion  OS no signs of CNVM       Intravitreal Injection, Pharmacologic Agent - OD - Right Eye       Time Out 06/25/2020. 3:14 PM. Confirmed correct patient, procedure, site, and patient consented.   Anesthesia Topical anesthesia was used. Anesthetic medications included Akten 3.5%.   Procedure Preparation included Ofloxacin , 10% betadine to eyelids, 5% betadine to ocular surface, Tobramycin 0.3%. A 30 gauge needle was used.   Injection:  2 mg aflibercept Gretta Cool) SOLN   NDC: L6038910, Lot: [id number]   Route: Intravitreal, Site: Right Eye, Waste: 0 mg  Post-op Post injection exam found visual acuity of at least counting fingers. The patient tolerated the procedure well. There were no complications. The patient received written and verbal post procedure care education. Post injection medications were not given.                 ASSESSMENT/PLAN:  Early stage nonexudative age-related macular degeneration of left eye Stable OS The nature of dry age related macular degeneration was discussed with the patient as well as its possible conversion to wet. The results of the AREDS 2 study was discussed with the patient. A diet rich in dark leafy green vegetables was advised and specific recommendations were made regarding supplements with AREDS 2 formulation . Control of hypertension and serum cholesterol may slow the disease. Smoking cessation is mandatory to slow the disease and diminish  the risk of progressing to wet age related macular degeneration. The patient was instructed in the use of an Amsler Grid and was told to return immediately for any changes in the Grid. Stressed to the patient do not rub eyes  Serous detachment of retinal pigment epithelium of right eye Resolved on therapy,  Exudative age-related macular degeneration of right eye with active choroidal neovascularization (  [place]) Much smaller since massive subretinal hemorrhage CNVM of mid 2020      ICD-10-CM   1. Exudative age-related macular degeneration of right eye with active choroidal neovascularization (HCC)  H35.3211 OCT, Retina - OU - Both Eyes    Intravitreal Injection, Pharmacologic Agent - OD - Right Eye    aflibercept (EYLEA) SOLN 2 mg  2. Early stage nonexudative age-related macular degeneration of left eye  H35.3121   3. Serous detachment of retinal pigment epithelium of right eye  H35.721     1.  We will repeat intravitreal Eylea OD today to maintain smaller disciform scar prevent active edges recurring  Currently at 87-month interval  2.  Dilate OU next, 4 months.  Will have intravitreal Eylea prepared but likely no injection  3.  Ophthalmic Meds Ordered this visit:  Meds ordered this encounter  Medications  . aflibercept (EYLEA) SOLN 2 mg       Return in about 4 months (around 10/23/2020) for DILATE OU, COLOR FP, OCT.  There are no Patient Instructions on file for this visit.   Explained the diagnoses, plan, and follow up with the patient and they expressed understanding.  Patient expressed understanding of the importance of proper follow up care.   Clent Demark Ashira Kirsten M.D. Diseases & Surgery of the Retina and Vitreous [facility] [place] 06/25/20     Abbreviations: M myopia (nearsighted); A astigmatism; H hyperopia (farsighted); P presbyopia; Mrx spectacle prescription;  CTL contact lenses; OD right eye; OS left eye; OU both eyes  XT exotropia; ET esotropia; PEK  punctate epithelial keratitis; PEE punctate epithelial erosions; DES dry eye syndrome; MGD meibomian gland dysfunction; ATs artificial tears; PFAT's preservative free artificial tears; [place] nuclear sclerotic cataract; PSC posterior subcapsular cataract; ERM epi-retinal membrane; PVD posterior vitreous detachment; RD retinal detachment; DM diabetes mellitus; DR diabetic retinopathy; NPDR non-proliferative diabetic retinopathy; PDR proliferative diabetic retinopathy; CSME clinically significant macular edema; DME diabetic macular edema; dbh dot blot hemorrhages; CWS cotton wool spot; POAG primary open angle glaucoma; C/D cup-to-disc ratio; HVF humphrey visual field; GVF goldmann visual field; OCT optical coherence tomography; IOP intraocular pressure; BRVO Branch retinal vein occlusion; CRVO central retinal vein occlusion; CRAO central retinal artery occlusion; BRAO branch retinal artery occlusion; RT retinal tear; SB scleral buckle; PPV pars plana vitrectomy; VH Vitreous hemorrhage; PRP panretinal laser photocoagulation; IVK intravitreal kenalog; VMT vitreomacular traction; MH Macular hole;  NVD neovascularization of the disc; NVE neovascularization elsewhere; AREDS age related eye disease study; ARMD age related macular degeneration; POAG primary open angle glaucoma; EBMD epithelial/anterior basement membrane dystrophy; ACIOL anterior chamber intraocular lens; IOL intraocular lens; PCIOL posterior chamber intraocular lens; Phaco/IOL phacoemulsification with intraocular lens placement; [place] photorefractive keratectomy; LASIK laser assisted in situ keratomileusis; HTN hypertension; DM diabetes mellitus; COPD chronic obstructive pulmonary disease

## 2020-06-25 NOTE — Assessment & Plan Note (Signed)
Resolved on therapy,

## 2020-06-25 NOTE — Assessment & Plan Note (Signed)
Much smaller since massive subretinal hemorrhage CNVM of mid 2020

## 2020-06-25 NOTE — Assessment & Plan Note (Signed)
Stable OS The nature of dry age related macular degeneration was discussed with the patient as well as its possible conversion to wet. The results of the AREDS 2 study was discussed with the patient. A diet rich in dark leafy green vegetables was advised and specific recommendations were made regarding supplements with AREDS 2 formulation . Control of hypertension and serum cholesterol may slow the disease. Smoking cessation is mandatory to slow the disease and diminish the risk of progressing to wet age related macular degeneration. The patient was instructed in the use of an Amsler Grid and was told to return immediately for any changes in the Grid. Stressed to the patient do not rub eyes

## 2020-06-28 ENCOUNTER — Other Ambulatory Visit: Payer: Self-pay

## 2020-06-28 DIAGNOSIS — E785 Hyperlipidemia, unspecified: Secondary | ICD-10-CM

## 2020-06-28 DIAGNOSIS — M810 Age-related osteoporosis without current pathological fracture: Secondary | ICD-10-CM

## 2020-06-28 DIAGNOSIS — I493 Ventricular premature depolarization: Secondary | ICD-10-CM

## 2020-06-28 DIAGNOSIS — E559 Vitamin D deficiency, unspecified: Secondary | ICD-10-CM

## 2020-06-28 LAB — CBC WITH DIFFERENTIAL/PLATELET
Absolute Monocytes: 667 cells/uL (ref 200–950)
Basophils Absolute: 57 cells/uL (ref 0–200)
Basophils Relative: 0.8 %
Eosinophils Absolute: 227 cells/uL (ref 15–500)
Eosinophils Relative: 3.2 %
HCT: 45.1 % — ABNORMAL HIGH (ref 35.0–45.0)
Hemoglobin: 15.1 g/dL (ref 11.7–15.5)
Lymphs Abs: 1945 cells/uL (ref 850–3900)
MCH: 30.4 pg (ref 27.0–33.0)
MCHC: 33.5 g/dL (ref 32.0–36.0)
MCV: 90.7 fL (ref 80.0–100.0)
MPV: 11.1 fL (ref 7.5–12.5)
Monocytes Relative: 9.4 %
Neutro Abs: 4203 cells/uL (ref 1500–7800)
Neutrophils Relative %: 59.2 %
Platelets: 279 10*3/uL (ref 140–400)
RBC: 4.97 10*6/uL (ref 3.80–5.10)
RDW: 12.8 % (ref 11.0–15.0)
Total Lymphocyte: 27.4 %
WBC: 7.1 10*3/uL (ref 3.8–10.8)

## 2020-06-28 LAB — COMPLETE METABOLIC PANEL WITH GFR
AG Ratio: 1.7 (calc) (ref 1.0–2.5)
ALT: 17 U/L (ref 6–29)
AST: 27 U/L (ref 10–35)
Albumin: 4.5 g/dL (ref 3.6–5.1)
Alkaline phosphatase (APISO): 50 U/L (ref 37–153)
BUN: 15 mg/dL (ref 7–25)
CO2: 32 mmol/L (ref 20–32)
Calcium: 10.1 mg/dL (ref 8.6–10.4)
Chloride: 102 mmol/L (ref 98–110)
Creat: 0.82 mg/dL (ref 0.60–0.88)
GFR, Est African American: 76 mL/min/{1.73_m2} (ref >=60)
GFR, Est Non African American: 65 mL/min/{1.73_m2} (ref >=60)
Globulin: 2.6 g/dL (calc) (ref 1.9–3.7)
Glucose, Bld: 83 mg/dL (ref 65–99)
Potassium: 4.1 mmol/L (ref 3.5–5.3)
Sodium: 141 mmol/L (ref 135–146)
Total Bilirubin: 0.5 mg/dL (ref 0.2–1.2)
Total Protein: 7.1 g/dL (ref 6.1–8.1)

## 2020-06-28 LAB — LIPID PANEL
Cholesterol: 212 mg/dL — ABNORMAL HIGH (ref <200)
HDL: 74 mg/dL (ref >=50)
LDL Cholesterol (Calc): 116 mg/dL (calc) — ABNORMAL HIGH
Non-HDL Cholesterol (Calc): 138 mg/dL (calc) — ABNORMAL HIGH (ref <130)
Total CHOL/HDL Ratio: 2.9 (calc) (ref <5.0)
Triglycerides: 109 mg/dL (ref <150)

## 2020-07-04 ENCOUNTER — Other Ambulatory Visit: Payer: Self-pay

## 2020-07-04 ENCOUNTER — Non-Acute Institutional Stay: Payer: Medicare Other | Admitting: Internal Medicine

## 2020-07-04 ENCOUNTER — Encounter: Payer: Self-pay | Admitting: Internal Medicine

## 2020-07-04 VITALS — BP 132/64 | HR 66 | Temp 96.8°F | Ht 59.0 in | Wt 103.1 lb

## 2020-07-04 DIAGNOSIS — I493 Ventricular premature depolarization: Secondary | ICD-10-CM | POA: Diagnosis not present

## 2020-07-04 DIAGNOSIS — E559 Vitamin D deficiency, unspecified: Secondary | ICD-10-CM

## 2020-07-04 DIAGNOSIS — G8929 Other chronic pain: Secondary | ICD-10-CM

## 2020-07-04 DIAGNOSIS — M545 Low back pain, unspecified: Secondary | ICD-10-CM | POA: Diagnosis not present

## 2020-07-04 DIAGNOSIS — L089 Local infection of the skin and subcutaneous tissue, unspecified: Secondary | ICD-10-CM | POA: Diagnosis not present

## 2020-07-04 DIAGNOSIS — E785 Hyperlipidemia, unspecified: Secondary | ICD-10-CM

## 2020-07-04 DIAGNOSIS — M810 Age-related osteoporosis without current pathological fracture: Secondary | ICD-10-CM | POA: Diagnosis not present

## 2020-07-04 MED ORDER — PNEUMOCOCCAL 13-VAL CONJ VACC IM SUSP: 0.5000 mL | INTRAMUSCULAR | 0 refills | Status: DC

## 2020-07-04 NOTE — Progress Notes (Signed)
Location:  [facility] Service:  Clinic (12)  Provider:   Code Status:  Goals of Care:  Advanced Directives 08/19/2017  Does Patient Have a Medical Advance Directive? Yes  Type of Estate agent of [place];Living will  Does patient want to make changes to medical advance directive? No - Patient declined  Copy of Healthcare Power of Attorney in Chart? Yes     Chief Complaint  Patient presents with  . Medical Management of Chronic Issues    Patient returns to the clinic for her 6 month follow up.    HPI: Patient is a 87 y.o. female seen today for medical management of chronic diseases.    Patient has history Osteoporosis and Right Eye Macular degeneration and Vit D Def  Active complain Small pustule on left side of scalp since she got Hair done  Low back pain Sometimes by Bending down No sciatica No falls. Walks with no Cane or walker  Lives with her husband. Independent in her ADLS and IADLS.Still Drive Walks with no assist. No Falls Has daughter in [place]    Past Medical History:  Diagnosis Date  . Osteoporosis   . PVC (premature ventricular contraction)   . Vitamin D deficiency     Past Surgical History:  Procedure Laterality Date  . TUBAL LIGATION  1969    Allergies  Allergen Reactions  . Sulfa Antibiotics     Outpatient Encounter Medications as of 07/04/2020  Medication Sig  . [DISCONTINUED] pneumococcal 13-valent conjugate vaccine (PREVNAR 13) SUSP injection Inject 0.5 mLs into the muscle tomorrow at 10 am.  . Cholecalciferol 1000 units tablet Take 1,000 Units by mouth daily.  Marland Kitchen denosumab (PROLIA) 60 MG/ML SOSY injection Inject 60 mg into the skin every 6 (six) months.  . Multiple Vitamins-Calcium (VIACTIV MULTI-VITAMIN) CHEW Chew 2 tablets by mouth daily.  . [DISCONTINUED] pneumococcal 13-valent conjugate vaccine (PREVNAR 13) SUSP injection Inject 0.5 mLs into the muscle tomorrow at 10 am for 1 dose.  .  [DISCONTINUED] pneumococcal 13-valent conjugate vaccine (PREVNAR 13) SUSP injection Inject 0.5 mLs into the muscle tomorrow at 10 am for 1 dose. PLEASE CANCEL THIS AND PREVIOUS ORDER SENT OVER. THANKS-YAVONDA CMA   No facility-administered encounter medications on file as of 07/04/2020.    Review of Systems:  Review of Systems  Review of Systems  Constitutional: Negative for activity change, appetite change, chills, diaphoresis, fatigue and fever.  HENT: Negative for mouth sores, postnasal drip, rhinorrhea, sinus pain and sore throat.   Respiratory: Negative for apnea, cough, chest tightness, shortness of breath and wheezing.   Cardiovascular: Negative for chest pain, palpitations and leg swelling.  Gastrointestinal: Negative for abdominal distention, abdominal pain, constipation, diarrhea, nausea and vomiting.  Genitourinary: Negative for dysuria and frequency.  Musculoskeletal: Negative for arthralgias, joint swelling and myalgias.  Skin: Negative for rash.  Neurological: Negative for dizziness, syncope, weakness, light-headedness and numbness.  Psychiatric/Behavioral: Negative for behavioral problems, confusion and sleep disturbance.      Health Maintenance  Topic Date Due  . TETANUS/TDAP  02/15/2024  . INFLUENZA VACCINE  Completed  . DEXA SCAN  Completed  . COVID-19 Vaccine  Completed  . PNA vac Low Risk Adult  Completed    Physical Exam: Vitals:   07/04/20 1448  BP: 132/64  Pulse: 66  Temp: (!) 96.8 F (36 C)  SpO2: 97%  Weight: 103 lb 1.6 oz (46.8 kg)  Height: 4\' 11"  (1.499 m)   Body mass index  is 20.82 kg/m. Physical Exam  Constitutional: Oriented to person, place, and time. Well-developed and well-nourished.  HENT:  Head: Normocephalic.  Mouth/Throat: Oropharynx is clear and moist.  Eyes: Pupils are equal, round, and reactive to light.  Neck: Neck supple.  Cardiovascular: Normal rate and normal heart sounds.  No murmur heard. Pulmonary/Chest: Effort normal  and breath sounds normal. No respiratory distress. No wheezes. She has no rales.  Abdominal: Soft. Bowel sounds are normal. No distension. There is no tenderness. There is no rebound.  Musculoskeletal: No edema. Back has no Point tenderness Straight leg negative Lymphadenopathy: none Neurological: Alert and oriented to person, place, and time.  Gait stable Skin: Skin is warm and dry.  Psychiatric: Normal mood and affect. Behavior is normal. Thought content normal.    Labs reviewed: Basic Metabolic Panel: Recent Labs    08/26/19 1010 06/28/20 0745  NA 141 141  K 4.3 4.1  CL 103 102  CO2 31 32  GLUCOSE 80 83  BUN 16 15  CREATININE 0.82 0.82  CALCIUM 9.6 10.1  TSH 2.61  --    Liver Function Tests: Recent Labs    08/26/19 1010 06/28/20 0745  AST 19 27  ALT 13 17  BILITOT 0.5 0.5  PROT 6.6 7.1   No results for input(s): LIPASE, AMYLASE in the last 8760 hours. No results for input(s): AMMONIA in the last 8760 hours. CBC: Recent Labs    08/26/19 1010 06/28/20 0745  WBC 6.7 7.1  NEUTROABS 3,886 4,203  HGB 14.5 15.1  HCT 44.2 45.1*  MCV 91.3 90.7  PLT 254 279   Lipid Panel: Recent Labs    08/26/19 1010 06/28/20 0745  CHOL 182 212*  HDL 66 74  LDLCALC 96 116*  TRIG 108 109  CHOLHDL 2.8 2.9   No results found for: HGBA1C  Procedures since last visit: Intravitreal Injection, Pharmacologic Agent - OD - Right Eye  Result Date: 06/25/2020 Time Out 06/25/2020. 3:14 PM. Confirmed correct patient, procedure, site, and patient consented. Anesthesia Topical anesthesia was used. Anesthetic medications included Akten 3.5%. Procedure Preparation included Ofloxacin , 10% betadine to eyelids, 5% betadine to ocular surface, Tobramycin 0.3%. A 30 gauge needle was used. Injection: 2 mg aflibercept Gretta Cool) SOLN   NDC: L6038910, Lot: [id number]   Route: Intravitreal, Site: Right Eye, Waste: 0 mg Post-op Post injection exam found visual acuity of at least counting fingers. The  patient tolerated the procedure well. There were no complications. The patient received written and verbal post procedure care education. Post injection medications were not given.   OCT, Retina - OU - Both Eyes  Result Date: 06/25/2020 Right Eye Quality was good. Scan locations included subfoveal. Central Foveal Thickness: 607. Findings include no IRF, no SRF, disciform scar. Left Eye Quality was good. Scan locations included subfoveal. Central Foveal Thickness: 295. Findings include abnormal foveal contour, no IRF, no SRF. Notes OD, subfoveal disciform scar now one third the size of incident event with massive subretinal hemorrhage CNVM from mid 2020.  No signs of active lesion OS no signs of CNVM   Assessment/Plan 1. Osteoporosis, unspecified osteoporosis type, unspecified pathological fracture presence On Prolia Was on Fosamax before Recent DEXA in 4/21 was Tscore -2.8  2. Pustule Topical Antibiotics  Call us if it gets bigger or worse  3. Chronic midline low back pain without sciatica Tylenol prn for now No red flag signs  4. Asymptomatic PVCs Labs are Normal  5. Hyperlipidemia, unspecified hyperlipidemia type Diet and Exercise for now  Low risk for CAD  6. Vitamin D deficiency Continue Supplement   Patient instructions Can put Over the counter antibiotics on the Pimple till it heals If it does not get better call our office Can take Tylenol 500 mg twice a day as needed for back pain If gets worse let us know  Labs/tests ordered:  * No order type specified * Next appt:  11/21/2020

## 2020-07-04 NOTE — Patient Instructions (Signed)
Can put Over the counter antibiotics on the Pimple till it heals If it does not get better call our office Can take Tylenol 500 mg twice a day as needed for back pain If gets worse let us know

## 2020-08-27 ENCOUNTER — Other Ambulatory Visit: Payer: Self-pay | Admitting: Obstetrics and Gynecology

## 2020-08-27 DIAGNOSIS — Z1231 Encounter for screening mammogram for malignant neoplasm of breast: Secondary | ICD-10-CM

## 2020-10-18 ENCOUNTER — Other Ambulatory Visit: Payer: Self-pay

## 2020-10-18 ENCOUNTER — Ambulatory Visit
Admission: RE | Admit: 2020-10-18 | Discharge: 2020-10-18 | Disposition: A | Discharge status: Home / Self Care | Payer: Medicare Other | Source: Ambulatory Visit | Attending: Obstetrics and Gynecology | Admitting: Obstetrics and Gynecology

## 2020-10-18 DIAGNOSIS — Z1231 Encounter for screening mammogram for malignant neoplasm of breast: Secondary | ICD-10-CM

## 2020-10-25 ENCOUNTER — Encounter (INDEPENDENT_AMBULATORY_CARE_PROVIDER_SITE_OTHER): Payer: Self-pay | Admitting: Ophthalmology

## 2020-10-25 ENCOUNTER — Other Ambulatory Visit: Payer: Self-pay

## 2020-10-25 ENCOUNTER — Ambulatory Visit (INDEPENDENT_AMBULATORY_CARE_PROVIDER_SITE_OTHER): Payer: Medicare Other | Admitting: Ophthalmology

## 2020-10-25 DIAGNOSIS — H353121 Nonexudative age-related macular degeneration, left eye, early dry stage: Secondary | ICD-10-CM | POA: Diagnosis not present

## 2020-10-25 DIAGNOSIS — H353211 Exudative age-related macular degeneration, right eye, with active choroidal neovascularization: Secondary | ICD-10-CM | POA: Diagnosis not present

## 2020-10-25 MED ORDER — AFLIBERCEPT 2MG/0.05ML IZ SOLN FOR KALEIDOSCOPE
2.0000 mg | INTRAVITREAL | Status: AC | PRN
  Administered 2020-10-25: 2 mg via INTRAVITREAL

## 2020-10-25 NOTE — Assessment & Plan Note (Signed)
No signs of CNVM OS 

## 2020-10-25 NOTE — Assessment & Plan Note (Signed)
Currently at 4 months follow-up, vastly improved from onset of disease September 2021 with massive subretinal hemorrhage now mostly resolved and CNVM involutional into a disciform scar.  OD.

## 2020-10-25 NOTE — Progress Notes (Signed)
10/25/2020     CHIEF COMPLAINT Patient presents for Retina Follow Up (54month fu ou [name] OD//Pt states [facility] OU stable since last visit. Pt denies FOL, floaters, or ocular pain OU. /)   HISTORY OF PRESENT ILLNESS: Julia Hinton is a 87 y.o. female who presents to the clinic today for:   HPI    Retina Follow Up    Diagnosis: Wet AMD   Laterality: right eye   Onset: 4 months ago   Duration: 4 months   Course: stable   Comments: 11month fu ou /Possible Eylea OD  Pt states VA OU stable since last visit. Pt denies FOL, floaters, or ocular pain OU.         Last edited by Demetrios Loll, COA on 10/25/2020  1:27 PM. (History)      Referring physician: Mahlon Gammon, MD [address] [place],  [place] [address]  HISTORICAL INFORMATION:   Selected notes from the MEDICAL RECORD NUMBER       CURRENT MEDICATIONS: No current outpatient medications on file. (Ophthalmic Drugs)   No current facility-administered medications for this visit. (Ophthalmic Drugs)   Current Outpatient Medications (Other)  Medication Sig  . Cholecalciferol 1000 units tablet Take 1,000 Units by mouth daily.  Marland Kitchen denosumab (PROLIA) 60 MG/ML SOSY injection Inject 60 mg into the skin every 6 (six) months.  . Multiple Vitamins-Calcium (VIACTIV MULTI-VITAMIN) CHEW Chew 2 tablets by mouth daily.   No current facility-administered medications for this visit. (Other)      REVIEW OF SYSTEMS:    ALLERGIES Allergies  Allergen Reactions  . Sulfa Antibiotics     PAST MEDICAL HISTORY Past Medical History:  Diagnosis Date  . Osteoporosis   . PVC (premature ventricular contraction)   . Vitamin D deficiency    Past Surgical History:  Procedure Laterality Date  . TUBAL LIGATION  1969    FAMILY HISTORY Family History  Problem Relation Age of Onset  . Dementia Mother   . Osteoporosis Mother   . Cancer Sister   . Breast cancer Sister     SOCIAL HISTORY Social History   Tobacco Use  .  Smoking status: Never Smoker  . Smokeless tobacco: Never Used  Vaping Use  . Vaping Use: Never used  Substance Use Topics  . Alcohol use: Yes    Alcohol/week: 2.0 standard drinks    Types: 2 Glasses of wine per week  . Drug use: No         OPHTHALMIC EXAM:  Base Eye Exam    Visual Acuity (ETDRS)      Right Left   Dist cc HM 20/30   Dist ph cc  20/25 -2   Correction: Glasses       Tonometry (Tonopen, 1:32 PM)      Right Left   Pressure 14 13       Pupils      Pupils Dark Light Shape React APD   Right PERRL 4 3 Round Brisk None   Left PERRL 4 3 Round Brisk None       Visual Fields      Left Right    Full    Restrictions  Total superior temporal, inferior temporal, superior nasal, inferior nasal deficiencies       Extraocular Movement      Right Left    Full Full       Neuro/Psych    Oriented x3: Yes   Mood/Affect: Normal  Dilation    Both eyes: 1.0% Mydriacyl, 2.5% Phenylephrine @ 1:32 PM        Slit Lamp and Fundus Exam    External Exam      Right Left   External Normal Normal       Slit Lamp Exam      Right Left   Lids/Lashes Normal Normal   Conjunctiva/Sclera White and quiet White and quiet   Cornea Clear Clear   Anterior Chamber Deep and quiet Deep and quiet   Iris Round and reactive Round and reactive   Lens Posterior chamber intraocular lens Posterior chamber intraocular lens   Anterior Vitreous Normal Normal       Fundus Exam      Right Left   Posterior Vitreous Posterior vitreous detachment Posterior vitreous detachment   Disc Normal Normal   C/D Ratio 0.3 0.5   Macula Disciform scar white fibrotic,, and submacular area 7 disc areas in size no active edges currently.  Intermediate age related macular degeneration, no macular thickening, no hemorrhage, no exudates   Vessels Normal Normal   Periphery Normal Normal          IMAGING AND PROCEDURES  Imaging and Procedures for 10/25/20  OCT, Retina - OU - Both Eyes        Right Eye Quality was borderline. Scan locations included subfoveal. Central Foveal Thickness: 607. Progression has been stable. Findings include no IRF, no SRF, disciform scar.   Left Eye Quality was good. Scan locations included subfoveal. Central Foveal Thickness: 290. Progression has been stable. Findings include abnormal foveal contour, no IRF, no SRF.   Notes OD, subfoveal disciform scar now one third the size of incident event with massive subretinal hemorrhage CNVM from mid 2020.  No signs of active lesion, no active intraretinal fluid  OS no signs of CNVM       Color Fundus Photography Optos - OU - Both Eyes       Right Eye Progression has improved. Disc findings include normal observations. Vessels : normal observations. Periphery : normal observations.   Left Eye Progression has been stable. Disc findings include normal observations. Macula : normal observations. Vessels : normal observations. Periphery : normal observations.   Notes Macular disciform scar centrally, no interval change with less hemorrhage, also with pigmentary degeneration and sites of previous subretinal hemorrhage now resolved       Intravitreal Injection, Pharmacologic Agent - OD - Right Eye       Time Out 10/25/2020. 1:53 PM. Confirmed correct patient, procedure, site, and patient consented.   Anesthesia Topical anesthesia was used. Anesthetic medications included Akten 3.5%.   Procedure Preparation included Ofloxacin , 10% betadine to eyelids, 5% betadine to ocular surface, Tobramycin 0.3%. A 30 gauge needle was used.   Injection:  2 mg aflibercept Gretta Cool) SOLN   NDC: [id number], Lot: [id number]   Route: Intravitreal, Site: Right Eye, Waste: 0 mg  Post-op Post injection exam found visual acuity of at least counting fingers. The patient tolerated the procedure well. There were no complications. The patient received written and verbal post procedure care education. Post injection  medications were not given.                 ASSESSMENT/PLAN:  Exudative age-related macular degeneration of right eye with active choroidal neovascularization (HCC) Currently at 4 months follow-up, vastly improved from onset of disease September 2021 with massive subretinal hemorrhage now mostly resolved and CNVM involutional into a disciform scar.  OD.  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 Exudative age-related macular degeneration of right eye with active choroidal neovascularization (HCC)  H35.3211 OCT, Retina - OU - Both Eyes    Color Fundus Photography Optos - OU - Both Eyes    Intravitreal Injection, Pharmacologic Agent - OD - Right Eye    aflibercept (EYLEA) SOLN 2 mg  2. 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  Much smaller central scotoma OD as compared to onset of massive subretinal hemorrhage due to CNVM.  Subfoveal fibrosis approximate 70 disc areas in size limits acuity OD.  Nonetheless perimacular region is safe from further extension of hemorrhage and scarring.  Currently at 48-month follow-up.  We will repeat examination again in 4 months with no planned injection next We will complete the inducement of quiescent's of disease with injection Eylea OD today   2.  Dilate OU next with no planned injection  3.  Ophthalmic Meds Ordered this visit:  Meds ordered this encounter  Medications  . aflibercept (EYLEA) SOLN 2 mg       Return in about 4 months (around 02/25/2021), or No planned injection, for DILATE OU, COLOR FP, OCT.  There are no Patient Instructions on file for this visit.   Explained the diagnoses, plan, and follow up with the patient and they expressed understanding.  Patient expressed understanding of the importance of proper follow up care.   Alford Highland Mignon Bechler M.D. Diseases & Surgery of the Retina and Vitreous [facility] 10/25/20     Abbreviations: M myopia (nearsighted); A astigmatism; H hyperopia (farsighted); P presbyopia; Mrx spectacle prescription;  CTL contact lenses; OD right eye; OS left eye; OU both eyes  XT exotropia; ET esotropia; PEK punctate epithelial keratitis; PEE punctate epithelial erosions; DES dry eye syndrome; MGD meibomian gland dysfunction; ATs artificial tears; PFAT's preservative free artificial tears; NSC nuclear sclerotic cataract; PSC posterior subcapsular cataract; ERM epi-retinal membrane; PVD posterior vitreous detachment; RD retinal detachment; DM diabetes mellitus; DR diabetic retinopathy; NPDR non-proliferative diabetic retinopathy; PDR proliferative diabetic retinopathy; CSME clinically significant macular edema; DME diabetic macular edema; dbh dot blot hemorrhages; CWS cotton wool spot; POAG primary open angle glaucoma; C/D cup-to-disc ratio; HVF humphrey visual field; GVF goldmann visual field; OCT optical coherence tomography; IOP intraocular pressure; BRVO Branch retinal vein occlusion; CRVO central retinal vein occlusion; CRAO central retinal artery occlusion; BRAO branch retinal artery occlusion; RT retinal tear; SB scleral buckle; PPV pars plana vitrectomy; VH Vitreous hemorrhage; PRP panretinal laser photocoagulation; IVK intravitreal kenalog; VMT vitreomacular traction; MH Macular hole;  NVD neovascularization of the disc; NVE neovascularization elsewhere; AREDS age related eye disease study; ARMD age related macular degeneration; POAG primary open angle glaucoma; EBMD epithelial/anterior basement membrane dystrophy; ACIOL anterior chamber intraocular lens; IOL intraocular lens; PCIOL posterior chamber intraocular lens; Phaco/IOL phacoemulsification with intraocular lens placement; PRK photorefractive keratectomy; LASIK laser assisted in situ keratomileusis; HTN hypertension; DM diabetes mellitus; COPD chronic obstructive pulmonary disease

## 2020-11-21 ENCOUNTER — Other Ambulatory Visit: Payer: Self-pay

## 2020-11-21 ENCOUNTER — Ambulatory Visit (INDEPENDENT_AMBULATORY_CARE_PROVIDER_SITE_OTHER): Payer: Medicare Other | Admitting: *Deleted

## 2020-11-21 DIAGNOSIS — M810 Age-related osteoporosis without current pathological fracture: Secondary | ICD-10-CM

## 2020-11-21 MED ORDER — DENOSUMAB 60 MG/ML ~~LOC~~ SOSY
60.0000 mg | PREFILLED_SYRINGE | Freq: Once | SUBCUTANEOUS | Status: AC
  Administered 2020-11-21: 60 mg via SUBCUTANEOUS

## 2021-01-02 ENCOUNTER — Encounter: Payer: Self-pay | Admitting: Internal Medicine

## 2021-01-04 ENCOUNTER — Other Ambulatory Visit: Payer: Self-pay | Admitting: Internal Medicine

## 2021-01-04 DIAGNOSIS — E559 Vitamin D deficiency, unspecified: Secondary | ICD-10-CM

## 2021-01-04 DIAGNOSIS — E785 Hyperlipidemia, unspecified: Secondary | ICD-10-CM

## 2021-01-04 DIAGNOSIS — M810 Age-related osteoporosis without current pathological fracture: Secondary | ICD-10-CM

## 2021-01-07 ENCOUNTER — Other Ambulatory Visit: Payer: Self-pay

## 2021-01-07 DIAGNOSIS — M810 Age-related osteoporosis without current pathological fracture: Secondary | ICD-10-CM

## 2021-01-07 DIAGNOSIS — E559 Vitamin D deficiency, unspecified: Secondary | ICD-10-CM

## 2021-01-07 DIAGNOSIS — E785 Hyperlipidemia, unspecified: Secondary | ICD-10-CM

## 2021-01-07 LAB — CBC WITH DIFFERENTIAL/PLATELET
Absolute Monocytes: 603 {cells}/uL (ref 200–950)
Basophils Absolute: 47 {cells}/uL (ref 0–200)
Basophils Relative: 0.7 %
Eosinophils Absolute: 221 {cells}/uL (ref 15–500)
Eosinophils Relative: 3.3 %
HCT: 43.5 % (ref 35.0–45.0)
Hemoglobin: 14.1 g/dL (ref 11.7–15.5)
Lymphs Abs: 1863 {cells}/uL (ref 850–3900)
MCH: 29.6 pg (ref 27.0–33.0)
MCHC: 32.4 g/dL (ref 32.0–36.0)
MCV: 91.4 fL (ref 80.0–100.0)
MPV: 10.6 fL (ref 7.5–12.5)
Monocytes Relative: 9 %
Neutro Abs: 3966 {cells}/uL (ref 1500–7800)
Neutrophils Relative %: 59.2 %
Platelets: 286 Thousand/uL (ref 140–400)
RBC: 4.76 Million/uL (ref 3.80–5.10)
RDW: 13.1 % (ref 11.0–15.0)
Total Lymphocyte: 27.8 %
WBC: 6.7 Thousand/uL (ref 3.8–10.8)

## 2021-01-07 LAB — COMPLETE METABOLIC PANEL WITH GFR
AG Ratio: 1.9 (calc) (ref 1.0–2.5)
ALT: 23 U/L (ref 6–29)
AST: 25 U/L (ref 10–35)
Albumin: 4.4 g/dL (ref 3.6–5.1)
Alkaline phosphatase (APISO): 49 U/L (ref 37–153)
BUN: 17 mg/dL (ref 7–25)
CO2: 31 mmol/L (ref 20–32)
Calcium: 9.5 mg/dL (ref 8.6–10.4)
Chloride: 103 mmol/L (ref 98–110)
Creat: 0.79 mg/dL (ref 0.60–0.95)
Globulin: 2.3 g/dL (calc) (ref 1.9–3.7)
Glucose, Bld: 78 mg/dL (ref 65–99)
Potassium: 4 mmol/L (ref 3.5–5.3)
Sodium: 142 mmol/L (ref 135–146)
Total Bilirubin: 0.4 mg/dL (ref 0.2–1.2)
Total Protein: 6.7 g/dL (ref 6.1–8.1)
eGFR: 73 mL/min/{1.73_m2} (ref >=60)

## 2021-01-07 LAB — LIPID PANEL
Cholesterol: 204 mg/dL — ABNORMAL HIGH
HDL: 74 mg/dL
LDL Cholesterol (Calc): 107 mg/dL — ABNORMAL HIGH
Non-HDL Cholesterol (Calc): 130 mg/dL — ABNORMAL HIGH
Total CHOL/HDL Ratio: 2.8 (calc)
Triglycerides: 124 mg/dL

## 2021-01-07 LAB — TSH: TSH: 2.51 m[IU]/L (ref 0.40–4.50)

## 2021-01-07 LAB — VITAMIN D 25 HYDROXY (VIT D DEFICIENCY, FRACTURES): Vit D, 25-Hydroxy: 65 ng/mL (ref 30–100)

## 2021-01-09 ENCOUNTER — Encounter: Payer: Medicare Other | Admitting: Internal Medicine

## 2021-01-09 ENCOUNTER — Non-Acute Institutional Stay: Payer: Medicare Other | Admitting: Internal Medicine

## 2021-01-09 ENCOUNTER — Other Ambulatory Visit: Payer: Self-pay

## 2021-01-09 ENCOUNTER — Encounter: Payer: Self-pay | Admitting: Internal Medicine

## 2021-01-09 VITALS — BP 142/70 | HR 58 | Temp 96.6°F | Ht 59.0 in | Wt 102.1 lb

## 2021-01-09 DIAGNOSIS — E785 Hyperlipidemia, unspecified: Secondary | ICD-10-CM

## 2021-01-09 DIAGNOSIS — E559 Vitamin D deficiency, unspecified: Secondary | ICD-10-CM

## 2021-01-09 DIAGNOSIS — I493 Ventricular premature depolarization: Secondary | ICD-10-CM

## 2021-01-09 DIAGNOSIS — M545 Low back pain, unspecified: Secondary | ICD-10-CM

## 2021-01-09 DIAGNOSIS — M810 Age-related osteoporosis without current pathological fracture: Secondary | ICD-10-CM | POA: Diagnosis not present

## 2021-01-09 DIAGNOSIS — H353211 Exudative age-related macular degeneration, right eye, with active choroidal neovascularization: Secondary | ICD-10-CM

## 2021-01-09 DIAGNOSIS — G8929 Other chronic pain: Secondary | ICD-10-CM

## 2021-01-09 DIAGNOSIS — R293 Abnormal posture: Secondary | ICD-10-CM

## 2021-01-09 NOTE — Patient Instructions (Signed)
Can Take Aleve PRN with Food for pain not relieved by Tylenol Use OTC Voltaren Gel PRN on the back

## 2021-01-09 NOTE — Progress Notes (Signed)
Location:  [facility] Service:  Clinic (12)  Provider:   Code Status:  Goals of Care:  Advanced Directives 01/09/2021  Does Patient Have a Medical Advance Directive? Yes  Type of Estate agent of [place];Living will  Does patient want to make changes to medical advance directive? No - Patient declined  Copy of Healthcare Power of Attorney in Chart? -     Chief Complaint  Patient presents with   Medical Management of Chronic Issues    Patient returns to the clinic for follow up. YW    HPI: Patient is a 87 y.o. female seen today for medical management of chronic diseases.    Patient has history Osteoporosis and Right Eye Macular degeneration and Vit D Def  She continues ot have issues with Lower back pain and now has noticed some change in her Posture No Red flag signs No Radiation down the legs No Falls. No Urinary or Bowel issues She is very active and walks with no assist Lives with her husband in [place]  Past Medical History:  Diagnosis Date   Osteoporosis    PVC (premature ventricular contraction)    Vitamin D deficiency     Past Surgical History:  Procedure Laterality Date   TUBAL LIGATION  1969    Allergies  Allergen Reactions   Sulfa Antibiotics     Outpatient Encounter Medications as of 01/09/2021  Medication Sig   Cholecalciferol 1000 units tablet Take 1,000 Units by mouth daily.   denosumab (PROLIA) 60 MG/ML SOSY injection Inject 60 mg into the skin every 6 (six) months.   Multiple Vitamins-Calcium (VIACTIV MULTI-VITAMIN) CHEW Chew 2 tablets by mouth daily.   No facility-administered encounter medications on file as of 01/09/2021.    Review of Systems:  Review of Systems Review of Systems  Constitutional: Negative for activity change, appetite change, chills, diaphoresis, fatigue and fever.  HENT: Negative for mouth sores, postnasal drip, rhinorrhea, sinus pain and sore throat.   Respiratory: Negative for  apnea, cough, chest tightness, shortness of breath and wheezing.   Cardiovascular: Negative for chest pain, palpitations and leg swelling.  Gastrointestinal: Negative for abdominal distention, abdominal pain, constipation, diarrhea, nausea and vomiting.  Genitourinary: Negative for dysuria and frequency.  Musculoskeletal: Negative for arthralgias, joint swelling and myalgias.  Skin: Negative for rash.  Neurological: Negative for dizziness, syncope, weakness, light-headedness and numbness.  Psychiatric/Behavioral: Negative for behavioral problems, confusion and sleep disturbance.    Health Maintenance  Topic Date Due   Zoster Vaccines- Shingrix (2 of 2) 10/21/2018   INFLUENZA VACCINE  01/21/2021   TETANUS/TDAP  02/15/2024   DEXA SCAN  Completed   COVID-19 Vaccine  Completed   PNA vac Low Risk Adult  Completed   HPV VACCINES  Aged Out    Physical Exam: Vitals:   01/09/21 1507  BP: (!) 142/70  Pulse: (!) 58  Temp: (!) 96.6 F (35.9 C)  SpO2: 98%  Weight: 102 lb 1.6 oz (46.3 kg)  Height: 4\' 11"  (1.499 m)   Body mass index is 20.62 kg/m. Physical Exam Constitutional: Oriented to person, place, and time. Well-developed and well-nourished.  HENT:  Head: Normocephalic.  Mouth/Throat: Oropharynx is clear and moist.  Eyes: Pupils are equal, round, and reactive to light.  Neck: Neck supple.  Cardiovascular: Normal rate and normal heart sounds.  No murmur heard. Pulmonary/Chest: Effort normal and breath sounds normal. No respiratory distress. No wheezes. She has no rales.  Abdominal: Soft. Bowel  sounds are normal. No distension. There is no tenderness. There is no rebound.  Musculoskeletal: No edema.  Lymphadenopathy: none Neurological: Alert and oriented to person, place, and time.  No Focal deficits Straight leg raising negative  Skin: Skin is warm and dry.  Psychiatric: Normal mood and affect. Behavior is normal. Thought content normal.    Labs reviewed: Basic Metabolic  Panel: Recent Labs    06/28/20 0745 01/04/21 0952  NA 141 142  K 4.1 4.0  CL 102 103  CO2 32 31  GLUCOSE 83 78  BUN 15 17  CREATININE 0.82 0.79  CALCIUM 10.1 9.5  TSH  --  2.51   Liver Function Tests: Recent Labs    06/28/20 0745 01/04/21 0952  AST 27 25  ALT 17 23  BILITOT 0.5 0.4  PROT 7.1 6.7   No results for input(s): LIPASE, AMYLASE in the last 8760 hours. No results for input(s): AMMONIA in the last 8760 hours. CBC: Recent Labs    06/28/20 0745 01/04/21 0952  WBC 7.1 6.7  NEUTROABS 4,203 3,966  HGB 15.1 14.1  HCT 45.1* 43.5  MCV 90.7 91.4  PLT 279 286   Lipid Panel: Recent Labs    06/28/20 0745 01/04/21 0952  CHOL 212* 204*  HDL 74 74  LDLCALC 116* 107*  TRIG 109 124  CHOLHDL 2.9 2.8   No results found for: HGBA1C  Procedures since last visit: No results found.  Assessment/Plan Abnormal posture - Plan: MR Lumbar Spine W Wo Contrast  Chronic midline low back pain without sciatica MRI of back ordered  Osteoporosis, unspecified osteoporosis type, unspecified pathological fracture presence On Prolia per [facility] office Was on Fosamax before Recent DEXA in 4/21 was Tscore -2.8  Hyperlipidemia,  Diet And Exercise Low risk for CAD  Vitamin D deficiency On Supplement  Asymptomatic PVCs  Exudative age-related macular degeneration of right eye with active choroidal neovascularization (HCC) Follows with Dr Luciana Axe   Labs/tests ordered:  * No order type specified * Next appt:  05/27/2021

## 2021-01-20 ENCOUNTER — Other Ambulatory Visit: Payer: Self-pay

## 2021-01-20 ENCOUNTER — Ambulatory Visit
Admission: RE | Admit: 2021-01-20 | Discharge: 2021-01-20 | Disposition: A | Discharge status: Home / Self Care | Payer: Medicare Other | Source: Ambulatory Visit | Attending: Internal Medicine | Admitting: Internal Medicine

## 2021-01-20 DIAGNOSIS — R293 Abnormal posture: Secondary | ICD-10-CM

## 2021-01-20 MED ORDER — GADOBENATE DIMEGLUMINE 529 MG/ML IV SOLN
9.0000 mL | Freq: Once | INTRAVENOUS | Status: AC | PRN
  Administered 2021-01-20: 9 mL via INTRAVENOUS

## 2021-01-23 ENCOUNTER — Telehealth: Payer: Self-pay | Admitting: Internal Medicine

## 2021-01-23 NOTE — Telephone Encounter (Signed)
Discussed her MRI results with Patient  Try Conservative management Will start with Therapy Order placed

## 2021-02-26 ENCOUNTER — Encounter (INDEPENDENT_AMBULATORY_CARE_PROVIDER_SITE_OTHER): Payer: Self-pay | Admitting: Ophthalmology

## 2021-02-26 ENCOUNTER — Other Ambulatory Visit: Payer: Self-pay

## 2021-02-26 ENCOUNTER — Ambulatory Visit (INDEPENDENT_AMBULATORY_CARE_PROVIDER_SITE_OTHER): Payer: Medicare Other | Admitting: Ophthalmology

## 2021-02-26 DIAGNOSIS — H3561 Retinal hemorrhage, right eye: Secondary | ICD-10-CM

## 2021-02-26 DIAGNOSIS — H35721 Serous detachment of retinal pigment epithelium, right eye: Secondary | ICD-10-CM

## 2021-02-26 DIAGNOSIS — H353211 Exudative age-related macular degeneration, right eye, with active choroidal neovascularization: Secondary | ICD-10-CM | POA: Diagnosis not present

## 2021-02-26 DIAGNOSIS — H353222 Exudative age-related macular degeneration, left eye, with inactive choroidal neovascularization: Secondary | ICD-10-CM | POA: Diagnosis not present

## 2021-02-26 DIAGNOSIS — H353121 Nonexudative age-related macular degeneration, left eye, early dry stage: Secondary | ICD-10-CM | POA: Diagnosis not present

## 2021-02-26 NOTE — Assessment & Plan Note (Signed)
Completely resolved. 

## 2021-02-26 NOTE — Assessment & Plan Note (Signed)
Resolved as component of active CNVM before

## 2021-02-26 NOTE — Assessment & Plan Note (Signed)
Minor OS no active changes

## 2021-02-26 NOTE — Progress Notes (Signed)
02/26/2021     CHIEF COMPLAINT Patient presents for  Chief Complaint  Patient presents with   Macular Degeneration      HISTORY OF PRESENT ILLNESS: Julia Hinton is a 87 y.o. female who presents to the clinic today for:   HPI   75-month follow-up status post injection of intravitreal Avastin OD for prior CNVM activity with profound vision loss already present due to geographic atrophy and prior cyst disciform scarring  No interval change in acuity per patient Last edited by Edmon Crape, MD on 02/26/2021  1:43 PM.      Referring physician: Mahlon Gammon, MD [address] [place],  [place] [address]  HISTORICAL INFORMATION:   Selected notes from the MEDICAL RECORD NUMBER       CURRENT MEDICATIONS: No current outpatient medications on file. (Ophthalmic Drugs)   No current facility-administered medications for this visit. (Ophthalmic Drugs)   Current Outpatient Medications (Other)  Medication Sig   Cholecalciferol 1000 units tablet Take 1,000 Units by mouth daily.   denosumab (PROLIA) 60 MG/ML SOSY injection Inject 60 mg into the skin every 6 (six) months.   Multiple Vitamins-Calcium (VIACTIV MULTI-VITAMIN) CHEW Chew 2 tablets by mouth daily.   No current facility-administered medications for this visit. (Other)      REVIEW OF SYSTEMS:    ALLERGIES Allergies  Allergen Reactions   Sulfa Antibiotics     PAST MEDICAL HISTORY Past Medical History:  Diagnosis Date   Osteoporosis    PVC (premature ventricular contraction)    Vitamin D deficiency    Past Surgical History:  Procedure Laterality Date   TUBAL LIGATION  1969    FAMILY HISTORY Family History  Problem Relation Age of Onset   Dementia Mother    Osteoporosis Mother    Cancer Sister    Breast cancer Sister     SOCIAL HISTORY Social History   Tobacco Use   Smoking status: Never   Smokeless tobacco: Never  Vaping Use   Vaping Use: Never used  Substance Use Topics   Alcohol use: Yes     Alcohol/week: 2.0 standard drinks    Types: 2 Glasses of wine per week   Drug use: No         OPHTHALMIC EXAM:  Base Eye Exam     Visual Acuity (ETDRS)       Right Left   Dist cc CF at 1' 20/20 -2         Neuro/Psych     Oriented x3: Yes   Mood/Affect: Normal           Slit Lamp and Fundus Exam     External Exam       Right Left   External Normal Normal         Slit Lamp Exam       Right Left   Lids/Lashes Normal Normal   Conjunctiva/Sclera White and quiet White and quiet   Cornea Clear Clear   Anterior Chamber Deep and quiet Deep and quiet   Iris Round and reactive Round and reactive   Lens Posterior chamber intraocular lens Posterior chamber intraocular lens   Anterior Vitreous Normal Normal         Fundus Exam       Right Left   Posterior Vitreous Posterior vitreous detachment Posterior vitreous detachment   Disc Normal Normal   C/D Ratio 0.3 0.5   Macula Disciform scar white fibrotic,, and submacular area 7 disc areas in size  no active edges currently.  Intermediate age related macular degeneration, no macular thickening, no hemorrhage, no exudates   Vessels Normal Normal   Periphery Normal Normal            IMAGING AND PROCEDURES  Imaging and Procedures for 02/26/21  OCT, Retina - OU - Both Eyes       Right Eye Quality was borderline. Scan locations included subfoveal. Central Foveal Thickness: 537. Progression has been stable. Findings include no IRF, no SRF, disciform scar.   Left Eye Quality was good. Scan locations included subfoveal. Central Foveal Thickness: 292. Progression has been stable. Findings include abnormal foveal contour, no IRF, no SRF.   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  No signs of active lesion, no active intraretinal fluid  OS no signs of CNVM     Color Fundus Photography Optos - OU - Both Eyes        Right Eye Progression has improved. Disc findings include normal observations. Vessels : normal observations. Periphery : normal observations.   Left Eye Progression has been stable. Disc findings include normal observations. Macula : normal observations. Vessels : normal observations. Periphery : normal observations.   Notes Macular disciform scar centrally, now at 4 months post recent examination and therapy OD. Original massive lesion larger and clinicians macula by magnification of 3, now completely involutional.  Pigmentary change demarcates prior massive subretinal hemorrhage.  OS with low-grade early ARMD findings drusen no other complication             ASSESSMENT/PLAN:  Exudative age-related macular degeneration of left eye with inactive choroidal neovascularization (HCC) OD now in active CNVM with large disciform scar approximately 10 disc areas in size much smaller by 75% region of subretinal hemorrhages and upon onsets over 1 year previous  Retinal hemorrhage of right eye Completely resolved  Serous detachment of retinal pigment epithelium of right eye Resolved as component of active CNVM before  Early stage nonexudative age-related macular degeneration of left eye Minor OS no active changes      ICD-10-CM   1. Exudative age-related macular degeneration of right eye with active choroidal neovascularization (HCC)  H35.3211 OCT, Retina - OU - Both Eyes    Color Fundus Photography Optos - OU - Both Eyes    2. Serous detachment of retinal pigment epithelium of right eye  H35.721 OCT, Retina - OU - Both Eyes    Color Fundus Photography Optos - OU - Both Eyes    3. Early stage nonexudative age-related macular degeneration of left eye  H35.3121 OCT, Retina - OU - Both Eyes    Color Fundus Photography Optos - OU - Both Eyes    4. Exudative age-related macular degeneration of left eye with inactive choroidal neovascularization (HCC)  H35.3222     5. Retinal  hemorrhage of right eye  H35.61       1.  We will observe OD and OS today no active maculopathy.  2.  From a disciform scar OD but not active today  3.  Ophthalmic Meds Ordered this visit:  No orders of the defined types were placed in this encounter.      Return in about 6 months (around 08/26/2021) for DILATE OU, COLOR FP, OCT.  Patient Instructions  Patient report new onset visual acuity declines or distortions   Explained the diagnoses, plan, and follow up with the patient and they expressed understanding.  Patient expressed understanding of the importance of proper follow up care.   Alford Highland Chyna Kneece M.D. Diseases & Surgery of the Retina and Vitreous [facility] 02/26/21     Abbreviations: M myopia (nearsighted); A astigmatism; H hyperopia (farsighted); P presbyopia; Mrx spectacle prescription;  CTL contact lenses; OD right eye; OS left eye; OU both eyes  XT exotropia; ET esotropia; PEK punctate epithelial keratitis; PEE punctate epithelial erosions; DES dry eye syndrome; MGD meibomian gland dysfunction; ATs artificial tears; PFAT's preservative free artificial tears; NSC nuclear sclerotic cataract; PSC posterior subcapsular cataract; ERM epi-retinal membrane; PVD posterior vitreous detachment; RD retinal detachment; DM diabetes mellitus; DR diabetic retinopathy; NPDR non-proliferative diabetic retinopathy; PDR proliferative diabetic retinopathy; CSME clinically significant macular edema; DME diabetic macular edema; dbh dot blot hemorrhages; CWS cotton wool spot; POAG primary open angle glaucoma; C/D cup-to-disc ratio; HVF humphrey visual field; GVF goldmann visual field; OCT optical coherence tomography; IOP intraocular pressure; BRVO Branch retinal vein occlusion; CRVO central retinal vein occlusion; CRAO central retinal artery occlusion; BRAO branch retinal artery occlusion; RT retinal tear; SB scleral buckle; PPV pars plana vitrectomy; VH Vitreous hemorrhage; PRP  panretinal laser photocoagulation; IVK intravitreal kenalog; VMT vitreomacular traction; MH Macular hole;  NVD neovascularization of the disc; NVE neovascularization elsewhere; AREDS age related eye disease study; ARMD age related macular degeneration; POAG primary open angle glaucoma; EBMD epithelial/anterior basement membrane dystrophy; ACIOL anterior chamber intraocular lens; IOL intraocular lens; PCIOL posterior chamber intraocular lens; Phaco/IOL phacoemulsification with intraocular lens placement; PRK photorefractive keratectomy; LASIK laser assisted in situ keratomileusis; HTN hypertension; DM diabetes mellitus; COPD chronic obstructive pulmonary disease

## 2021-02-26 NOTE — Patient Instructions (Signed)
Patient report new onset visual acuity declines or distortions

## 2021-02-26 NOTE — Assessment & Plan Note (Signed)
OD now in active CNVM with large disciform scar approximately 10 disc areas in size much smaller by 75% region of subretinal hemorrhages and upon onsets over 1 year previous

## 2021-03-13 ENCOUNTER — Encounter: Payer: Self-pay | Admitting: Internal Medicine

## 2021-03-13 ENCOUNTER — Other Ambulatory Visit: Payer: Self-pay

## 2021-03-13 ENCOUNTER — Non-Acute Institutional Stay: Payer: Medicare Other | Admitting: Internal Medicine

## 2021-03-13 VITALS — BP 162/74 | HR 73 | Temp 96.6°F | Ht 59.0 in | Wt 101.5 lb

## 2021-03-13 DIAGNOSIS — S81812A Laceration without foreign body, left lower leg, initial encounter: Secondary | ICD-10-CM | POA: Diagnosis not present

## 2021-03-13 DIAGNOSIS — M545 Low back pain, unspecified: Secondary | ICD-10-CM

## 2021-03-13 DIAGNOSIS — R030 Elevated blood-pressure reading, without diagnosis of hypertension: Secondary | ICD-10-CM

## 2021-03-13 DIAGNOSIS — G8929 Other chronic pain: Secondary | ICD-10-CM

## 2021-03-13 NOTE — Progress Notes (Signed)
Location: [facility] Service:  Clinic (12)  Provider:   Code Status:  Goals of Care:  Advanced Directives 01/09/2021  Does Patient Have a Medical Advance Directive? Yes  Type of Estate agent of [place];Living will  Does patient want to make changes to medical advance directive? No - Patient declined  Copy of Healthcare Power of Attorney in Chart? -     Chief Complaint  Patient presents with   Acute Visit    Bleeding veins     HPI: Patient is a 87 y.o. female seen today for an acute visit for 'My Leg is Bleeding'  Patient has history Osteoporosis and Right Eye Macular degeneration and Vit D Def Recent Low back Pain due to L1-5 Severe Stenosis She is getting Therapy and it is helping her pain  Today she had seen dermatology and was itching near her left ankle when she suddenly started bleeding. Her husband got scared and called the nurse and our office Nurse did clean it and bandaged it She is here to follow She was very anxious and her BP was Elevated    Past Medical History:  Diagnosis Date   Osteoporosis    PVC (premature ventricular contraction)    Vitamin D deficiency     Past Surgical History:  Procedure Laterality Date   TUBAL LIGATION  1969    Allergies  Allergen Reactions   Sulfa Antibiotics     Outpatient Encounter Medications as of 03/13/2021  Medication Sig   acetaminophen (TYLENOL) 500 MG tablet Take 1,000 mg by mouth 2 (two) times daily.   Cholecalciferol 1000 units tablet Take 1,000 Units by mouth daily.   denosumab (PROLIA) 60 MG/ML SOSY injection Inject 60 mg into the skin every 6 (six) months.   ibuprofen (ADVIL) 200 MG tablet Take 200 mg by mouth every 6 (six) hours as needed.   Multiple Vitamins-Calcium (VIACTIV MULTI-VITAMIN) CHEW Chew 2 tablets by mouth daily.   No facility-administered encounter medications on file as of 03/13/2021.    Review of Systems:  Review of Systems Review of  Systems  Constitutional: Negative for activity change, appetite change, chills, diaphoresis, fatigue and fever.  HENT: Negative for mouth sores, postnasal drip, rhinorrhea, sinus pain and sore throat.   Respiratory: Negative for apnea, cough, chest tightness, shortness of breath and wheezing.   Cardiovascular: Negative for chest pain, palpitations and leg swelling.  Gastrointestinal: Negative for abdominal distention, abdominal pain, constipation, diarrhea, nausea and vomiting.  Genitourinary: Negative for dysuria and frequency.  Musculoskeletal: Negative for arthralgias, joint swelling and myalgias.  Skin: Negative for rash.  Neurological: Negative for dizziness, syncope, weakness, light-headedness and numbness.  Psychiatric/Behavioral: Negative for behavioral problems, confusion and sleep disturbance.  Very anxious Health Maintenance  Topic Date Due   Zoster Vaccines- Shingrix (2 of 2) 10/21/2018   INFLUENZA VACCINE  01/21/2021   TETANUS/TDAP  02/15/2024   DEXA SCAN  Completed   COVID-19 Vaccine  Completed   HPV VACCINES  Aged Out    Physical Exam: Vitals:   03/13/21 1532  BP: (!) 162/74  Pulse: 73  Temp: (!) 96.6 F (35.9 C)  SpO2: 95%  Weight: 101 lb 8 oz (46 kg)  Height: 4\' 11"  (1.499 m)   Body mass index is 20.5 kg/m. Physical Exam Constitutional: Oriented to person, place, and time. Well-developed and well-nourished.  HENT:  Head: Normocephalic.  Mouth/Throat: Oropharynx is clear and moist.  Eyes: Pupils are equal, round, and reactive to light.  Neck: Neck supple.  Cardiovascular: Normal rate and normal heart sounds.  No murmur heard. Pulmonary/Chest: Effort normal and breath sounds normal. No respiratory distress. No wheezes. She has no rales.  Abdominal: Soft. Bowel sounds are normal. No distension. There is no tenderness. There is no rebound.  Musculoskeletal: No edema.  Small area in her Left leg  where she Picked on the skin and had small tear  and she also  has varicose veins in her Legs and feet  But not Bleeding anymore Lymphadenopathy: none Neurological: Alert and oriented to person, place, and time.  Skin: Skin is warm and dry.  Psychiatric: Normal mood and affect. Behavior is normal. Thought content normal.   Labs reviewed: Basic Metabolic Panel: Recent Labs    06/28/20 0745 01/04/21 0952  NA 141 142  K 4.1 4.0  CL 102 103  CO2 32 31  GLUCOSE 83 78  BUN 15 17  CREATININE 0.82 0.79  CALCIUM 10.1 9.5  TSH  --  2.51   Liver Function Tests: Recent Labs    06/28/20 0745 01/04/21 0952  AST 27 25  ALT 17 23  BILITOT 0.5 0.4  PROT 7.1 6.7   No results for input(s): LIPASE, AMYLASE in the last 8760 hours. No results for input(s): AMMONIA in the last 8760 hours. CBC: Recent Labs    06/28/20 0745 01/04/21 0952  WBC 7.1 6.7  NEUTROABS 4,203 3,966  HGB 15.1 14.1  HCT 45.1* 43.5  MCV 90.7 91.4  PLT 279 286   Lipid Panel: Recent Labs    06/28/20 0745 01/04/21 0952  CHOL 212* 204*  HDL 74 74  LDLCALC 116* 107*  TRIG 109 124  CHOLHDL 2.9 2.8   No results found for: HGBA1C  Procedures since last visit: OCT, Retina - OU - Both Eyes  Result Date: 02/26/2021 Right Eye Quality was borderline. Scan locations included subfoveal. Central Foveal Thickness: 537. Progression has been stable. Findings include no IRF, no SRF, disciform scar. Left Eye Quality was good. Scan locations included subfoveal. Central Foveal Thickness: 292. Progression has been stable. Findings include abnormal foveal contour, no IRF, no SRF. 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  No signs of active lesion, no active intraretinal fluid OS no signs of CNVM  Color Fundus Photography Optos - OU - Both Eyes  Result Date: 02/26/2021 Right Eye Progression has improved. Disc findings include normal observations. Vessels : normal observations. Periphery : normal  observations. Left Eye Progression has been stable. Disc findings include normal observations. Macula : normal observations. Vessels : normal observations. Periphery : normal observations. Notes Macular disciform scar centrally, now at 4 months post recent examination and therapy OD. Original massive lesion larger and clinicians macula by magnification of 3, now completely involutional.  Pigmentary change demarcates prior massive subretinal hemorrhage. 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

## 2021-03-23 ENCOUNTER — Emergency Department (HOSPITAL_COMMUNITY)
Admission: EM | Admit: 2021-03-23 | Discharge: 2021-03-23 | Disposition: A | Discharge status: Home / Self Care | Payer: Medicare Other | Attending: Emergency Medicine | Admitting: Emergency Medicine

## 2021-03-23 ENCOUNTER — Emergency Department (HOSPITAL_COMMUNITY): Payer: Medicare Other

## 2021-03-23 DIAGNOSIS — S81812A Laceration without foreign body, left lower leg, initial encounter: Secondary | ICD-10-CM | POA: Insufficient documentation

## 2021-03-23 DIAGNOSIS — R55 Syncope and collapse: Secondary | ICD-10-CM | POA: Insufficient documentation

## 2021-03-23 DIAGNOSIS — I83892 Varicose veins of left lower extremities with other complications: Secondary | ICD-10-CM | POA: Diagnosis not present

## 2021-03-23 DIAGNOSIS — X58XXXA Exposure to other specified factors, initial encounter: Secondary | ICD-10-CM | POA: Diagnosis not present

## 2021-03-23 LAB — COMPREHENSIVE METABOLIC PANEL
ALT: 21 U/L (ref 0–44)
AST: 30 U/L (ref 15–41)
Albumin: 3.7 g/dL (ref 3.5–5.0)
Alkaline Phosphatase: 36 U/L — ABNORMAL LOW (ref 38–126)
Anion gap: 9 (ref 5–15)
BUN: 25 mg/dL — ABNORMAL HIGH (ref 8–23)
CO2: 26 mmol/L (ref 22–32)
Calcium: 9.3 mg/dL (ref 8.9–10.3)
Chloride: 103 mmol/L (ref 98–111)
Creatinine, Ser: 0.87 mg/dL (ref 0.44–1.00)
GFR, Estimated: >60 mL/min (ref >60)
Glucose, Bld: 128 mg/dL — ABNORMAL HIGH (ref 70–99)
Potassium: 4.9 mmol/L (ref 3.5–5.1)
Sodium: 138 mmol/L (ref 135–145)
Total Bilirubin: 0.6 mg/dL (ref 0.3–1.2)
Total Protein: 5.9 g/dL — ABNORMAL LOW (ref 6.5–8.1)

## 2021-03-23 LAB — CBC WITH DIFFERENTIAL/PLATELET
Abs Immature Granulocytes: 0.04 10*3/uL (ref 0.00–0.07)
Basophils Absolute: 0 10*3/uL (ref 0.0–0.1)
Basophils Relative: 0 %
Eosinophils Absolute: 0.1 10*3/uL (ref 0.0–0.5)
Eosinophils Relative: 1 %
HCT: 36.1 % (ref 36.0–46.0)
Hemoglobin: 11.5 g/dL — ABNORMAL LOW (ref 12.0–15.0)
Immature Granulocytes: 0 %
Lymphocytes Relative: 14 %
Lymphs Abs: 1.4 10*3/uL (ref 0.7–4.0)
MCH: 30.3 pg (ref 26.0–34.0)
MCHC: 31.9 g/dL (ref 30.0–36.0)
MCV: 95 fL (ref 80.0–100.0)
Monocytes Absolute: 0.7 10*3/uL (ref 0.1–1.0)
Monocytes Relative: 7 %
Neutro Abs: 7.5 10*3/uL (ref 1.7–7.7)
Neutrophils Relative %: 78 %
Platelets: 232 10*3/uL (ref 150–400)
RBC: 3.8 MIL/uL — ABNORMAL LOW (ref 3.87–5.11)
RDW: 14.8 % (ref 11.5–15.5)
WBC: 9.7 10*3/uL (ref 4.0–10.5)
nRBC: 0 % (ref 0.0–0.2)

## 2021-03-23 LAB — TROPONIN I (HIGH SENSITIVITY): Troponin I (High Sensitivity): 6 ng/L (ref <18)

## 2021-03-23 MED ORDER — LIDOCAINE-EPINEPHRINE (PF) 2 %-1:200000 IJ SOLN: 20.0000 mL | Freq: Once | INTRAMUSCULAR | Status: AC

## 2021-03-23 MED ORDER — SODIUM CHLORIDE 0.9 % IV BOLUS
500.0000 mL | Freq: Once | INTRAVENOUS | Status: AC
  Administered 2021-03-23: 500 mL via INTRAVENOUS

## 2021-03-23 MED ORDER — LIDOCAINE-EPINEPHRINE (PF) 2 %-1:200000 IJ SOLN
INTRAMUSCULAR | Status: AC
  Administered 2021-03-23: 10 mL
  Filled 2021-03-23: qty 20

## 2021-03-23 MED ORDER — LIDOCAINE HCL (PF) 1 % IJ SOLN: 5.0000 mL | Freq: Once | INTRAMUSCULAR | Status: DC

## 2021-03-23 MED ORDER — LIDOCAINE-EPINEPHRINE 2 %-1:100000 IJ SOLN: 20.0000 mL | Freq: Once | INTRAMUSCULAR | Status: DC

## 2021-03-23 NOTE — ED Notes (Signed)
E-signature pad unavailable at time of pt discharge. This RN discussed discharge materials with pt and answered all pt questions. Pt stated understanding of discharge material. ? ?

## 2021-03-23 NOTE — ED Provider Notes (Addendum)
[facility] EMERGENCY DEPARTMENT Provider Note   CSN: [id number] Arrival date & time: 03/23/21  1859     History No chief complaint on file.   Julia Hinton is a 87 y.o. female.  87 y.o female with a PMH of PVC, Osteoporosis presents to the ED via EMS chief complaint of left ankle varicose vein bleeding.  Patient reports she was getting ready for dinner, when suddenly she bumped her left ankle, which she previously had a varicose vein bleeding about a week ago.  Reports previously this had stopped with pressure, however she states that she likely remove the scab today.  According to EMS report, left foot was covered in blood, side a small bucket.  Reports that patient is a presyncopal episode.  Patient reports she was staring at the nursing staff, "everybody turned pain "and felt like she was going to pass out.  She had a palpable pulse with a systolic in the 70s, EMS took the blood pressure once again Wednesday went inside the ambulance, patient's color returned, she had a blood pressure with a 118 systolic.  Patient states this has never happened in the past.  She currently is on no blood thinners.  She denies any pain or any recent sick contacts or illness.  The history is provided by the patient and medical records.      Past Medical History:  Diagnosis Date   Osteoporosis    PVC (premature ventricular contraction)    Vitamin D deficiency     Patient Active Problem List   Diagnosis Date Noted   ERRONEOUS ENCOUNTER--DISREGARD 06/18/2020   Exudative age-related macular degeneration of left eye with inactive choroidal neovascularization (HCC) 10/24/2019   Serous detachment of retinal pigment epithelium of right eye 10/24/2019   Retinal hemorrhage of right eye 10/24/2019   Early stage nonexudative age-related macular degeneration of left eye 10/24/2019   Vitamin D deficiency 03/11/2017   Osteoporosis 03/11/2017   Asymptomatic PVCs 03/11/2017    Past Surgical  History:  Procedure Laterality Date   TUBAL LIGATION  1969     OB History   No obstetric history on file.     Family History  Problem Relation Age of Onset   Dementia Mother    Osteoporosis Mother    Cancer Sister    Breast cancer Sister     Social History   Tobacco Use   Smoking status: Never   Smokeless tobacco: Never  Vaping Use   Vaping Use: Never used  Substance Use Topics   Alcohol use: Yes    Alcohol/week: 2.0 standard drinks    Types: 2 Glasses of wine per week   Drug use: No    Home Medications Prior to Admission medications   Medication Sig Start Date End Date Taking? Authorizing Provider  acetaminophen (TYLENOL) 500 MG tablet Take 1,000 mg by mouth 2 (two) times daily.    [provider]  Cholecalciferol 1000 units tablet Take 1,000 Units by mouth daily.    [provider]  denosumab (PROLIA) 60 MG/ML SOSY injection Inject 60 mg into the skin every 6 (six) months. 10/19/19   Mahlon Gammon, MD  ibuprofen (ADVIL) 200 MG tablet Take 200 mg by mouth every 6 (six) hours as needed.    [provider]  Multiple Vitamins-Calcium (VIACTIV MULTI-VITAMIN) CHEW Chew 2 tablets by mouth daily.    [provider]    Allergies    Sulfa antibiotics  Review of Systems   Review of Systems  Constitutional:  Negative for chills and fever.  HENT:  Negative for sore throat.   Respiratory:  Negative for shortness of breath.   Cardiovascular:  Negative for chest pain.  Gastrointestinal:  Negative for abdominal pain, nausea and vomiting.  Genitourinary:  Negative for flank pain.  Musculoskeletal:  Negative for back pain.  Skin:  Negative for pallor and wound.  Neurological:  Positive for syncope and light-headedness. Negative for dizziness and headaches.  All other systems reviewed and are negative.  Physical Exam Updated Vital Signs BP (!) 149/57   Pulse 70   Temp 98.2 F (36.8 C) (Oral)   Resp 15   Ht 4\' 11"  (1.499 m)   Wt 44.9  kg   SpO2 100%   BMI 20.00 kg/m   Physical Exam Vitals and nursing note reviewed.  Constitutional:      Appearance: Normal appearance.  HENT:     Head: Normocephalic and atraumatic.     Mouth/Throat:     Mouth: Mucous membranes are moist.  Eyes:     Pupils: Pupils are equal, round, and reactive to light.  Cardiovascular:     Rate and Rhythm: Normal rate.  Pulmonary:     Effort: Pulmonary effort is normal.  Abdominal:     General: Abdomen is flat.     Tenderness: There is no abdominal tenderness. There is no right CVA tenderness or left CVA tenderness.  Musculoskeletal:     Cervical back: Normal range of motion and neck supple.     Comments: Stable wound with bleeding controlled at this time above the left medial malleolus.  Skin:    General: Skin is warm and dry.  Neurological:     Mental Status: She is alert and oriented to person, place, and time.     Comments: Alert, oriented, thought content appropriate. Speech fluent without evidence of aphasia. Able to follow 2 step commands without difficulty.  Cranial Nerves:  II:  Peripheral visual fields grossly normal, pupils, round, reactive to light III,IV, VI: ptosis not present, extra-ocular motions intact bilaterally  V,VII: smile symmetric, facial light touch sensation equal VIII: hearing grossly normal bilaterally  IX,X: midline uvula rise  XI: bilateral shoulder shrug equal and strong XII: midline tongue extension  Motor:  5/5 in upper and lower extremities bilaterally including strong and equal grip strength and dorsiflexion/plantar flexion Sensory: light touch normal in all extremities.  Cerebellar: normal finger-to-nose with bilateral upper extremities, pronator drift negative       ED Results / Procedures / Treatments   Labs (all labs ordered are listed, but only abnormal results are displayed) Labs Reviewed  COMPREHENSIVE METABOLIC PANEL - Abnormal; Notable for the following components:      Result Value    Glucose, Bld 128 (*)    BUN 25 (*)    Total Protein 5.9 (*)    Alkaline Phosphatase 36 (*)    All other components within normal limits  CBC WITH DIFFERENTIAL/PLATELET - Abnormal; Notable for the following components:   RBC 3.80 (*)    Hemoglobin 11.5 (*)    All other components within normal limits  CBC WITH DIFFERENTIAL/PLATELET  URINALYSIS, ROUTINE W REFLEX MICROSCOPIC  TROPONIN I (HIGH SENSITIVITY)    EKG EKG Interpretation  Date/Time:  Saturday March 23 2021 19:38:37 EDT Ventricular Rate:  59 PR Interval:  158 QRS Duration: 138 QT Interval:  441 QTC Calculation: 437 R Axis:   1 Text Interpretation: Sinus rhythm Right bundle branch block Minimal ST elevation, inferior leads No  previous tracing Confirmed by Gwyneth Sprout ([id number]) on 03/23/2021 7:41:16 PM  Radiology DG Chest Port 1 View  Result Date: 03/23/2021 CLINICAL DATA:  Hypotension and bleeding varicose vein EXAM: PORTABLE CHEST 1 VIEW COMPARISON:  None. FINDINGS: Cardiac shadow is at the upper limits of normal in size. Aortic calcifications are noted. Lungs are well aerated bilaterally. No focal infiltrate or sizable effusion is seen. No bony abnormality is seen. IMPRESSION: No acute abnormality noted. Electronically Signed   By: Alcide Clever M.D.   On: 03/23/2021 20:06    Procedures .Marland KitchenLaceration Repair  Date/Time: 03/23/2021 10:30 PM Performed by: Claude Manges, PA-C Authorized by: Claude Manges, PA-C   Consent:    Consent obtained:  Verbal   Consent given by:  Patient   Risks discussed:  Infection, pain, poor cosmetic result and vascular damage   Alternatives discussed:  Delayed treatment Universal protocol:    Patient identity confirmed:  Verbally with patient Anesthesia:    Anesthesia method:  Local infiltration   Local anesthetic:  Lidocaine 1% w/o epi Laceration details:    Location:  Leg   Leg location:  L lower leg   Length (cm):  1   Depth (mm):  0.1 Treatment:    Area cleansed with:  Saline  and soap and water   Amount of cleaning:  Extensive Skin repair:    Repair method:  Sutures   Suture size:  5-0 and 3-0   Suture material:  Fast-absorbing gut   Suture technique:  Figure eight   Number of sutures:  2 Approximation:    Approximation:  Close Repair type:    Repair type:  Simple Post-procedure details:    Procedure completion:  Tolerated well, no immediate complications   Medications Ordered in ED Medications  sodium chloride 0.9 % bolus 500 mL (0 mLs Intravenous Stopped 03/23/21 2010)  lidocaine-EPINEPHrine (XYLOCAINE W/EPI) 2 %-1:200000 (PF) injection 20 mL (10 mLs Other Given by Other 03/23/21 2218)    ED Course  I have reviewed the triage vital signs and the nursing notes.  Pertinent labs & imaging results that were available during my care of the patient were reviewed by me and considered in my medical decision making (see chart for details).    MDM Rules/Calculators/A&P  Patient with no pertinent past medical history presents to the ED status post syncopal episode.  Patient has had a varicose vein bleed on Wednesday, this reoccurred today while she was getting ready for dinner.  She reports there was a good amount of blood, her foot was noted to be in a bucket, when EMS arrived she was noted to be hypotensive, pale.  They later rechecked her blood pressure and she was back up to normal.  She is currently on no blood thinners.  She does not have any fevers, no other complaints.  During my evaluation overall well-appearing, her color has returned.  Her vitals are within normal limits with any signs of tachycardia, or hypoxia.  She moves all upper and lower extremities.  Bleeding is controlled at this time with pressure dressing, will obtain orthostatic vitals.  Provided with n 500 bolus.  Labs without any leukocytosis on CBC, hemoglobin is 11.5 and stable.  CMP without any electrolyte derangement, current levels within normal limits.  LFTs are unremarkable.  First  troponin is 6, will need to obtain delta.  Her chest x-ray without any signs of pulmonary edema or pneumonia.  Orthostatics on today's visit are unremarkable.  Repeat troponin has been canceled as patient  is chest pain-free and have a lower suspicion for ACS.  Suspect more so vagal episode after witnessing all the blood pouring out.  I have placed to figure a to her left ankle, she tolerated the procedure well.  We discussed results of her labs.  Otherwise reports improvement in her symptoms without any chest pain, shortness of breath, abdominal pain at this time.  Return precautions discussed at length, patient is stable for discharge.  Patient reevaluated by me, doubt any further bleeding from her left ankle varicose vein.  A dressing has been applied, we discussed return precautions.  She is agreeable to plan and treatment.  Portions of this note were generated with Scientist, clinical (histocompatibility and immunogenetics). Dictation errors may occur despite best attempts at proofreading.  Final Clinical Impression(s) / ED Diagnoses Final diagnoses:  Bleeding from varicose veins of left lower extremity    Rx / DC Orders ED Discharge Orders     None        Claude Manges, PA-C 03/23/21 2248    Claude Manges, PA-C 03/23/21 2311    Claude Manges, PA-C 03/23/21 2312    Gwyneth Sprout, MD 03/24/21 1900

## 2021-03-23 NOTE — ED Notes (Signed)
Pt wound site began to bleed through the wrapping while standing for orthostatic vital signs. EDP made aware.

## 2021-03-23 NOTE — ED Triage Notes (Addendum)
Pt bib EMS from [facility] facility. Had L-ankle varicose vein bleeding last week, was treated, and the scab was self-removed today and began hemorrhaging. Per EMS, foot of affected site has normal pulse. Loss of consciousness today while attempting, palpated BP 74 that rose to 118 without fluid intervention. Pt is not on blood thinner medications. Bleeding controlled on arrival to [facility] ED  EMS vitals: 128/86 Pulse 66 RR 16 97% Room air  18G IV in L AC by EMS

## 2021-03-23 NOTE — Discharge Instructions (Addendum)
We placed two sutures to the left ankle, these will dissolve in the next couple of days.  You also had a pressure dressing applied to your left leg, please keep this in place for the next day.  Please avoid picking and the scab, or any trauma to your left ankle so we do not experience bleeding again.  Laboratory results are within normal limits, please up with your PCP as needed

## 2021-03-23 NOTE — ED Notes (Signed)
Sutured wound wrapped with non-adhesive pad, ABD pad, coban, and kerlix.

## 2021-03-29 ENCOUNTER — Telehealth: Payer: Self-pay

## 2021-03-29 DIAGNOSIS — I83899 Varicose veins of unspecified lower extremities with other complications: Secondary | ICD-10-CM

## 2021-03-29 NOTE — Telephone Encounter (Signed)
Lets Schedule her for Labs on Thurs

## 2021-03-29 NOTE — Telephone Encounter (Signed)
Patient would like for you to look over her visit at the hospital and advise if she need to have some labs done to check her blood levels.

## 2021-03-29 NOTE — Telephone Encounter (Signed)
Patient scheduled. DWP.

## 2021-04-04 ENCOUNTER — Other Ambulatory Visit: Payer: Self-pay

## 2021-04-04 DIAGNOSIS — I83899 Varicose veins of unspecified lower extremities with other complications: Secondary | ICD-10-CM

## 2021-04-05 LAB — COMPLETE METABOLIC PANEL WITH GFR
AG Ratio: 2.1 (calc) (ref 1.0–2.5)
ALT: 16 U/L (ref 6–29)
AST: 23 U/L (ref 10–35)
Albumin: 4.2 g/dL (ref 3.6–5.1)
Alkaline phosphatase (APISO): 42 U/L (ref 37–153)
BUN: 14 mg/dL (ref 7–25)
CO2: 30 mmol/L (ref 20–32)
Calcium: 9.4 mg/dL (ref 8.6–10.4)
Chloride: 104 mmol/L (ref 98–110)
Creat: 0.74 mg/dL (ref 0.60–0.95)
Globulin: 2 g/dL (calc) (ref 1.9–3.7)
Glucose, Bld: 80 mg/dL (ref 65–99)
Potassium: 4.4 mmol/L (ref 3.5–5.3)
Sodium: 142 mmol/L (ref 135–146)
Total Bilirubin: 0.3 mg/dL (ref 0.2–1.2)
Total Protein: 6.2 g/dL (ref 6.1–8.1)
eGFR: 79 mL/min/{1.73_m2} (ref >=60)

## 2021-04-05 LAB — CBC WITH DIFFERENTIAL/PLATELET
Absolute Monocytes: 590 cells/uL (ref 200–950)
Basophils Absolute: 30 cells/uL (ref 0–200)
Basophils Relative: 0.5 %
Eosinophils Absolute: 159 cells/uL (ref 15–500)
Eosinophils Relative: 2.7 %
HCT: 35.1 % (ref 35.0–45.0)
Hemoglobin: 11.6 g/dL — ABNORMAL LOW (ref 11.7–15.5)
Lymphs Abs: 1764 cells/uL (ref 850–3900)
MCH: 31.2 pg (ref 27.0–33.0)
MCHC: 33 g/dL (ref 32.0–36.0)
MCV: 94.4 fL (ref 80.0–100.0)
MPV: 10.2 fL (ref 7.5–12.5)
Monocytes Relative: 10 %
Neutro Abs: 3357 cells/uL (ref 1500–7800)
Neutrophils Relative %: 56.9 %
Platelets: 354 10*3/uL (ref 140–400)
RBC: 3.72 10*6/uL — ABNORMAL LOW (ref 3.80–5.10)
RDW: 13.1 % (ref 11.0–15.0)
Total Lymphocyte: 29.9 %
WBC: 5.9 10*3/uL (ref 3.8–10.8)

## 2021-04-08 ENCOUNTER — Encounter: Payer: Self-pay | Admitting: Orthopedic Surgery

## 2021-04-08 ENCOUNTER — Non-Acute Institutional Stay: Payer: Medicare Other | Admitting: Orthopedic Surgery

## 2021-04-08 ENCOUNTER — Other Ambulatory Visit: Payer: Self-pay

## 2021-04-08 VITALS — BP 142/58 | HR 64 | Temp 98.7°F | Resp 15 | Ht 59.0 in | Wt 102.0 lb

## 2021-04-08 DIAGNOSIS — D649 Anemia, unspecified: Secondary | ICD-10-CM

## 2021-04-08 DIAGNOSIS — I83899 Varicose veins of unspecified lower extremities with other complications: Secondary | ICD-10-CM | POA: Diagnosis not present

## 2021-04-08 NOTE — Progress Notes (Signed)
Careteam: Patient Care Team: Mahlon Gammon, MD as PCP - General (Internal Medicine) Ngetich, Donalee Citrin, NP as Nurse Practitioner (Family Medicine)  Seen by: Hazle Nordmann, AGNP-C  PLACE OF SERVICE:  [facility]  Advanced Directive information    Allergies  Allergen Reactions   Sulfa Antibiotics     No chief complaint on file.    HPI: Patient is a 87 y.o. female seen today fr acute visit due to bleeding varicose vein.   Labs discussed with patient.   A few days prior to ED visit, she reports getting dressed and noticed blood running from her ankle. Believes blood vessel burst. She notified [place] nurse and she was able to stop bleeding with pressure.   10/01 she states the same blood vessel in her left ankle burst again. She applied pressure, bleeding did not stop. [place] nurse came and was unable to stop bleeding. During this incident she began feeling dizzy and almost passed out. Initial blood pressure with SBP in 70's, quickly rebounded to SBP > 110's after EMS arrived. She was taken to [facility] ED by EMS. She received 2 stitches to left inner ankle and bleeding stopped. Stitches dissolvable, no f/u required.   Today, she denies bleeding from left ankle. Reports feeling tired since incident. She is worried she lost too much blood. Labs drawn 10/12, hemoglobin 11.6, other labs unremarkable. Denies feeling dizziness or light headed. Asking about eating more iron rich foods in her diet. Denies pain to lower legs.   She is not on blood thinner or blood pressure medications at this time.    Review of Systems:  Review of Systems  Constitutional:  Positive for malaise/fatigue. Negative for chills, fever and weight loss.  Respiratory:  Negative for cough, shortness of breath and wheezing.   Cardiovascular:  Negative for chest pain and leg swelling.  Skin:        Varicose veins  Neurological:  Negative for dizziness, tingling and weakness.  Psychiatric/Behavioral:  Negative for  depression. The patient is not nervous/anxious and does not have insomnia.    Past Medical History:  Diagnosis Date   Osteoporosis    PVC (premature ventricular contraction)    Vitamin D deficiency    Past Surgical History:  Procedure Laterality Date   TUBAL LIGATION  1969   Social History:   reports that she has never smoked. She has never used smokeless tobacco. She reports current alcohol use of about 2.0 standard drinks per week. She reports that she does not use drugs.  Family History  Problem Relation Age of Onset   Dementia Mother    Osteoporosis Mother    Cancer Sister    Breast cancer Sister     Medications: Patient's Medications  New Prescriptions   No medications on file  Previous Medications   ACETAMINOPHEN (TYLENOL) 500 MG TABLET    Take 1,000 mg by mouth 2 (two) times daily.   CHOLECALCIFEROL 1000 UNITS TABLET    Take 1,000 Units by mouth daily.   DENOSUMAB (PROLIA) 60 MG/ML SOSY INJECTION    Inject 60 mg into the skin every 6 (six) months.   IBUPROFEN (ADVIL) 200 MG TABLET    Take 200 mg by mouth every 6 (six) hours as needed.   MULTIPLE VITAMINS-CALCIUM (VIACTIV MULTI-VITAMIN) CHEW    Chew 2 tablets by mouth daily.  Modified Medications   No medications on file  Discontinued Medications   No medications on file    Physical Exam:  There  were no vitals filed for this visit. There is no height or weight on file to calculate BMI. Wt Readings from Last 3 Encounters:  03/23/21 99 lb (44.9 kg)  03/13/21 101 lb 8 oz (46 kg)  01/09/21 102 lb 1.6 oz (46.3 kg)    Physical Exam Vitals reviewed.  Constitutional:      General: She is not in acute distress. HENT:     Head: Normocephalic.  Cardiovascular:     Rate and Rhythm: Normal rate and regular rhythm.     Pulses: Normal pulses.     Heart sounds: Normal heart sounds. No murmur heard. Pulmonary:     Effort: Pulmonary effort is normal. No respiratory distress.     Breath sounds: Normal breath sounds. No  wheezing.  Skin:    General: Skin is warm and dry.     Findings: Lesion present.     Comments: Varicose veins scattered throughout left and right ankles. Left medical ankle with small closed area, stitches intact, area CDI, no drainage, surrounding skin intact.   Neurological:     General: No focal deficit present.     Mental Status: She is alert and oriented to person, place, and time.  Psychiatric:        Mood and Affect: Mood normal.        Behavior: Behavior normal.    Labs reviewed: Basic Metabolic Panel: Recent Labs    01/04/21 0952 03/23/21 1930 04/03/21 0859  NA 142 138 142  K 4.0 4.9 4.4  CL 103 103 104  CO2 31 26 30   GLUCOSE 78 128* 80  BUN 17 25* 14  CREATININE 0.79 0.87 0.74  CALCIUM 9.5 9.3 9.4  TSH 2.51  --   --    Liver Function Tests: Recent Labs    01/04/21 0952 03/23/21 1930 04/03/21 0859  AST 25 30 23   ALT 23 21 16   ALKPHOS  --  36*  --   BILITOT 0.4 0.6 0.3  PROT 6.7 5.9* 6.2  ALBUMIN  --  3.7  --    No results for input(s): LIPASE, AMYLASE in the last 8760 hours. No results for input(s): AMMONIA in the last 8760 hours. CBC: Recent Labs    01/04/21 0952 03/23/21 2026 04/03/21 0859  WBC 6.7 9.7 5.9  NEUTROABS 3,966 7.5 3,357  HGB 14.1 11.5* 11.6*  HCT 43.5 36.1 35.1  MCV 91.4 95.0 94.4  PLT 286 232 354   Lipid Panel: Recent Labs    06/28/20 0745 01/04/21 0952  CHOL 212* 204*  HDL 74 74  LDLCALC 116* 107*  TRIG 109 124  CHOLHDL 2.9 2.8   TSH: Recent Labs    01/04/21 0952  TSH 2.51   A1C: No results found for: HGBA1C   Assessment/Plan 1. Bleeding from varicose vein - left medical ankle, CDI, area closed, stitches still present - varicose veins asymptomatic - discussed keeping ankles covered for protection - discussed keeping skin moist with lotion - recommend seeing vein specialist if incidents keep occurring - advised to apply pressure if bleeding returns - advised applying cold compress to area if bleeding  returns  2. Low hemoglobin - Hgb 11.6 04/03/2021 - asymptomatic at this time - discussed iron rich foods  Total time: 27 minutes. Greater than 50% of total time spent doing patient education on skin care and how to stop bleeding.   Next appt: none Kailen Name 01/06/21  [facility] [phone]

## 2021-04-08 NOTE — Patient Instructions (Addendum)
Iron rich foods: Fruits Dark Chocolate Broccoli Red meats Legumes Leafy greens Tofu Pumpkin seeds  Recommend eating one serving daily

## 2021-05-27 ENCOUNTER — Ambulatory Visit (INDEPENDENT_AMBULATORY_CARE_PROVIDER_SITE_OTHER): Payer: Medicare Other

## 2021-05-27 ENCOUNTER — Other Ambulatory Visit: Payer: Self-pay

## 2021-05-27 DIAGNOSIS — M810 Age-related osteoporosis without current pathological fracture: Secondary | ICD-10-CM

## 2021-05-27 MED ORDER — DENOSUMAB 60 MG/ML ~~LOC~~ SOSY
60.0000 mg | PREFILLED_SYRINGE | Freq: Once | SUBCUTANEOUS | Status: AC
  Administered 2021-05-27: 60 mg via SUBCUTANEOUS

## 2021-07-03 ENCOUNTER — Other Ambulatory Visit: Payer: Self-pay | Admitting: Internal Medicine

## 2021-07-03 DIAGNOSIS — E785 Hyperlipidemia, unspecified: Secondary | ICD-10-CM

## 2021-07-03 DIAGNOSIS — D649 Anemia, unspecified: Secondary | ICD-10-CM

## 2021-07-03 DIAGNOSIS — M810 Age-related osteoporosis without current pathological fracture: Secondary | ICD-10-CM

## 2021-07-04 ENCOUNTER — Other Ambulatory Visit: Payer: Self-pay

## 2021-07-04 DIAGNOSIS — M810 Age-related osteoporosis without current pathological fracture: Secondary | ICD-10-CM

## 2021-07-04 DIAGNOSIS — E785 Hyperlipidemia, unspecified: Secondary | ICD-10-CM

## 2021-07-04 DIAGNOSIS — D649 Anemia, unspecified: Secondary | ICD-10-CM

## 2021-07-04 LAB — CBC WITH DIFFERENTIAL/PLATELET
Absolute Monocytes: 593 cells/uL (ref 200–950)
Basophils Absolute: 63 cells/uL (ref 0–200)
Basophils Relative: 0.8 %
Eosinophils Absolute: 158 cells/uL (ref 15–500)
Eosinophils Relative: 2 %
HCT: 43.5 % (ref 35.0–45.0)
Hemoglobin: 14 g/dL (ref 11.7–15.5)
Lymphs Abs: 2070 cells/uL (ref 850–3900)
MCH: 28.5 pg (ref 27.0–33.0)
MCHC: 32.2 g/dL (ref 32.0–36.0)
MCV: 88.6 fL (ref 80.0–100.0)
MPV: 10.5 fL (ref 7.5–12.5)
Monocytes Relative: 7.5 %
Neutro Abs: 5017 cells/uL (ref 1500–7800)
Neutrophils Relative %: 63.5 %
Platelets: 319 10*3/uL (ref 140–400)
RBC: 4.91 10*6/uL (ref 3.80–5.10)
RDW: 13.5 % (ref 11.0–15.0)
Total Lymphocyte: 26.2 %
WBC: 7.9 10*3/uL (ref 3.8–10.8)

## 2021-07-04 LAB — COMPLETE METABOLIC PANEL WITH GFR
AG Ratio: 2 (calc) (ref 1.0–2.5)
ALT: 14 U/L (ref 6–29)
AST: 23 U/L (ref 10–35)
Albumin: 4.7 g/dL (ref 3.6–5.1)
Alkaline phosphatase (APISO): 47 U/L (ref 37–153)
BUN: 19 mg/dL (ref 7–25)
CO2: 30 mmol/L (ref 20–32)
Calcium: 10 mg/dL (ref 8.6–10.4)
Chloride: 102 mmol/L (ref 98–110)
Creat: 0.75 mg/dL (ref 0.60–0.95)
Globulin: 2.4 g/dL (calc) (ref 1.9–3.7)
Glucose, Bld: 69 mg/dL (ref 65–99)
Potassium: 4.2 mmol/L (ref 3.5–5.3)
Sodium: 141 mmol/L (ref 135–146)
Total Bilirubin: 0.5 mg/dL (ref 0.2–1.2)
Total Protein: 7.1 g/dL (ref 6.1–8.1)
eGFR: 77 mL/min/{1.73_m2} (ref >=60)

## 2021-07-04 LAB — LIPID PANEL
Cholesterol: 223 mg/dL — ABNORMAL HIGH (ref <200)
HDL: 84 mg/dL (ref >=50)
LDL Cholesterol (Calc): 116 mg/dL (calc) — ABNORMAL HIGH
Non-HDL Cholesterol (Calc): 139 mg/dL (calc) — ABNORMAL HIGH (ref <130)
Total CHOL/HDL Ratio: 2.7 (calc) (ref <5.0)
Triglycerides: 122 mg/dL (ref <150)

## 2021-07-10 ENCOUNTER — Encounter: Payer: Self-pay | Admitting: Internal Medicine

## 2021-07-10 ENCOUNTER — Telehealth: Payer: Self-pay

## 2021-07-10 ENCOUNTER — Other Ambulatory Visit: Payer: Self-pay

## 2021-07-10 ENCOUNTER — Non-Acute Institutional Stay: Payer: Medicare Other | Admitting: Internal Medicine

## 2021-07-10 VITALS — BP 135/111 | HR 66 | Temp 97.2°F | Ht 59.0 in | Wt 101.2 lb

## 2021-07-10 DIAGNOSIS — I493 Ventricular premature depolarization: Secondary | ICD-10-CM

## 2021-07-10 DIAGNOSIS — R030 Elevated blood-pressure reading, without diagnosis of hypertension: Secondary | ICD-10-CM

## 2021-07-10 DIAGNOSIS — M810 Age-related osteoporosis without current pathological fracture: Secondary | ICD-10-CM | POA: Diagnosis not present

## 2021-07-10 DIAGNOSIS — M545 Low back pain, unspecified: Secondary | ICD-10-CM

## 2021-07-10 DIAGNOSIS — E785 Hyperlipidemia, unspecified: Secondary | ICD-10-CM | POA: Diagnosis not present

## 2021-07-10 DIAGNOSIS — H353211 Exudative age-related macular degeneration, right eye, with active choroidal neovascularization: Secondary | ICD-10-CM

## 2021-07-10 DIAGNOSIS — G8929 Other chronic pain: Secondary | ICD-10-CM

## 2021-07-10 DIAGNOSIS — I83899 Varicose veins of unspecified lower extremities with other complications: Secondary | ICD-10-CM | POA: Diagnosis not present

## 2021-07-10 DIAGNOSIS — E559 Vitamin D deficiency, unspecified: Secondary | ICD-10-CM

## 2021-07-10 NOTE — Patient Instructions (Signed)
Making referal for Your back pain

## 2021-07-10 NOTE — Progress Notes (Signed)
Location:  [facility] of Service:  Clinic (12)  Provider:   Code Status:  Goals of Care:  Advanced Directives 03/23/2021  Does Patient Have a Medical Advance Directive? Yes  Type of Advance Directive Out of facility DNR (pink MOST or yellow form)  Does patient want to make changes to medical advance directive? -  Copy of Healthcare Power of Attorney in Chart? -     Chief Complaint  Patient presents with   Medical Management of Chronic Issues    Patient returns to the clinic for 6 month follow up.     HPI: Patient is a 88 y.o. female seen today for medical management of chronic diseases.    Patient has history Osteoporosis and Right Eye Macular degeneration and Vit D Def Recent Low back Pain due to L1-5 Severe Stenosis  Chrinic Back pain Uses Tylenol and Advil for her pain Got therapy but pain still bothers her Usually when she bends down or stands for long time in her Kitchen Does not want to go to surgeon wants ot know if she can get Back Injections Skin tear with Variceal Bleed Wants to know if she needs to see Vascular Surgeon No Recent Bleeding  She is very active and walks with no assist Lives with her [place] in [place]  Past Medical History:  Diagnosis Date   Osteoporosis    PVC (premature ventricular contraction)    Vitamin D deficiency     Past Surgical History:  Procedure Laterality Date   TUBAL LIGATION  1969    Allergies  Allergen Reactions   Sulfa Antibiotics     Outpatient Encounter Medications as of 07/10/2021  Medication Sig   acetaminophen (TYLENOL) 500 MG tablet Take 1,000 mg by mouth 2 (two) times daily.   Cholecalciferol 1000 units tablet Take 1,000 Units by mouth daily.   denosumab (PROLIA) 60 MG/ML SOSY injection Inject 60 mg into the skin every 6 (six) months.   ibuprofen (ADVIL) 200 MG tablet Take 200 mg by mouth daily as needed.   Multiple Vitamins-Calcium (VIACTIV MULTI-VITAMIN) CHEW Chew 2 tablets by mouth daily.    No facility-administered encounter medications on file as of 07/10/2021.    Review of Systems:  Review of Systems  Constitutional:  Negative for activity change and appetite change.  HENT: Negative.    Respiratory:  Negative for cough and shortness of breath.   Cardiovascular:  Negative for leg swelling.  Gastrointestinal:  Negative for constipation.  Genitourinary: Negative.   Musculoskeletal:  Positive for back pain. Negative for arthralgias, gait problem and myalgias.  Skin: Negative.   Neurological:  Negative for dizziness and weakness.  Psychiatric/Behavioral:  Negative for confusion, dysphoric mood and sleep disturbance.    Health Maintenance  Topic Date Due   Zoster Vaccines- Shingrix (2 of 2) 10/21/2018   COVID-19 Vaccine (5 - Booster for Moderna series) 06/04/2021   TETANUS/TDAP  02/15/2024   Pneumonia Vaccine 57+ Years old  Completed   INFLUENZA VACCINE  Completed   DEXA SCAN  Completed   HPV VACCINES  Aged Out    Physical Exam: Vitals:   07/10/21 1438  BP: (!) 135/111  Pulse: 66  Temp: (!) 97.2 F (36.2 C)  SpO2: 98%  Weight: 101 lb 3.2 oz (45.9 kg)  Height: 4\' 11"  (1.499 m)   Body mass index is 20.44 kg/m. Physical Exam Vitals reviewed.  Constitutional:      Appearance: Normal appearance.  HENT:     Head:  Normocephalic.     Nose: Nose normal.     Mouth/Throat:     Mouth: Mucous membranes are moist.     Pharynx: Oropharynx is clear.  Eyes:     Pupils: Pupils are equal, round, and reactive to light.  Cardiovascular:     Rate and Rhythm: Normal rate and regular rhythm.     Pulses: Normal pulses.     Heart sounds: Normal heart sounds. No murmur heard. Pulmonary:     Effort: Pulmonary effort is normal.     Breath sounds: Normal breath sounds.  Abdominal:     General: Abdomen is flat. Bowel sounds are normal.     Palpations: Abdomen is soft.  Musculoskeletal:        General: No swelling.     Cervical back: Neck supple.  Skin:    General: Skin  is warm.  Neurological:     General: No focal deficit present.     Mental Status: She is alert and oriented to person, place, and time.  Psychiatric:        Mood and Affect: Mood normal.        Thought Content: Thought content normal.    Labs reviewed: Basic Metabolic Panel: Recent Labs    01/04/21 0952 03/23/21 1930 04/03/21 0859 07/04/21 0800  NA 142 138 142 141  K 4.0 4.9 4.4 4.2  CL 103 103 104 102  CO2 31 26 30 30   GLUCOSE 78 128* 80 69  BUN 17 25* 14 19  CREATININE 0.79 0.87 0.74 0.75  CALCIUM 9.5 9.3 9.4 10.0  TSH 2.51  --   --   --    Liver Function Tests: Recent Labs    03/23/21 1930 04/03/21 0859 07/04/21 0800  AST 30 23 23   ALT 21 16 14   ALKPHOS 36*  --   --   BILITOT 0.6 0.3 0.5  PROT 5.9* 6.2 7.1  ALBUMIN 3.7  --   --    No results for input(s): LIPASE, AMYLASE in the last 8760 hours. No results for input(s): AMMONIA in the last 8760 hours. CBC: Recent Labs    03/23/21 2026 04/03/21 0859 07/04/21 0800  WBC 9.7 5.9 7.9  NEUTROABS 7.5 3,357 5,017  HGB 11.5* 11.6* 14.0  HCT 36.1 35.1 43.5  MCV 95.0 94.4 88.6  PLT 232 354 319   Lipid Panel: Recent Labs    01/04/21 0952 07/04/21 0800  CHOL 204* 223*  HDL 74 84  LDLCALC 107* 116*  TRIG 124 122  CHOLHDL 2.8 2.7   No results found for: HGBA1C  Procedures since last visit: No results found.  Assessment/Plan 1. Osteoporosis, unspecified osteoporosis type, unspecified pathological fracture presence On Prolia  2. Hyperlipidemia, unspecified hyperlipidemia type Labs revived Does not want to start Statin right now  3. Bleeding from varicose vein No Recent Episodes   4. Chronic midline low back pain without sciatica Referal to Ortho for Back injection  5. Asymptomatic PVCs   6. Exudative age-related macular degeneration of right eye with active choroidal neovascularization (HCC)   7. Vitamin D deficiency On supplement  8 Elevated BP Called again to patient to make sure she  follows with Facility Nurses and gets her BP checked  Labs/tests ordered:  * No order type specified * Next appt:  11/28/2021

## 2021-07-10 NOTE — Telephone Encounter (Signed)
Per Dr. Chales Abrahams  Have you blood pressure checked weekly for four weeks the [facility] [place] nurse.   DWP, no further questions.

## 2021-08-14 ENCOUNTER — Encounter: Payer: Self-pay | Admitting: Internal Medicine

## 2021-08-14 ENCOUNTER — Other Ambulatory Visit: Payer: Self-pay

## 2021-08-14 ENCOUNTER — Non-Acute Institutional Stay: Payer: Medicare Other | Admitting: Internal Medicine

## 2021-08-14 VITALS — BP 140/70 | HR 69 | Temp 97.1°F | Ht 59.0 in | Wt 99.3 lb

## 2021-08-14 DIAGNOSIS — E785 Hyperlipidemia, unspecified: Secondary | ICD-10-CM

## 2021-08-14 DIAGNOSIS — I10 Essential (primary) hypertension: Secondary | ICD-10-CM | POA: Diagnosis not present

## 2021-08-14 DIAGNOSIS — G8929 Other chronic pain: Secondary | ICD-10-CM

## 2021-08-14 DIAGNOSIS — M545 Low back pain, unspecified: Secondary | ICD-10-CM | POA: Diagnosis not present

## 2021-08-14 DIAGNOSIS — M810 Age-related osteoporosis without current pathological fracture: Secondary | ICD-10-CM | POA: Diagnosis not present

## 2021-08-14 NOTE — Progress Notes (Signed)
Location: [place] of Service:  Clinic (12)  Provider:   Code Status:  Goals of Care:  Advanced Directives 03/23/2021  Does Patient Have a Medical Advance Directive? Yes  Type of Advance Directive Out of facility DNR (pink MOST or yellow form)  Does patient want to make changes to medical advance directive? -  Copy of [place] in Chart? -     Chief Complaint  Patient presents with   Medical Management of Chronic Issues    Patient returns to the clinic for follow up on her blood pressure. She has been monitoring her blood pressures.     HPI: Patient is a 88 y.o. female seen today for an acute visit for Follow up of her BP   Patient has history Osteoporosis and Right Eye Macular degeneration and Vit D Def Recent Low back Pain due to L1-5 Severe Stenosis  Patient BP has been running high recently She says it is due to her Low back pain and there is some work going on at her Apartment and it is stressing her She is planning to see Ortho this week for her Low Back pain  IS getting her BP checked by Facility Nurse but did not bring her readings   Past Surgical History:  Procedure Laterality Date   TUBAL LIGATION  1969    Allergies  Allergen Reactions   Sulfa Antibiotics     Outpatient Encounter Medications as of 08/14/2021  Medication Sig   acetaminophen (TYLENOL) 500 MG tablet Take 1,000 mg by mouth 2 (two) times daily.   Cholecalciferol 1000 units tablet Take 1,000 Units by mouth daily.   denosumab (PROLIA) 60 MG/ML SOSY injection Inject 60 mg into the skin every 6 (six) months.   Multiple Vitamins-Calcium (VIACTIV MULTI-VITAMIN) CHEW Chew 2 tablets by mouth daily.   [DISCONTINUED] ibuprofen (ADVIL) 200 MG tablet Take 200 mg by mouth daily as needed.   No facility-administered encounter medications on file as of 08/14/2021.    Review of Systems:  Review of Systems  Constitutional:  Negative for activity change and appetite  change.  HENT: Negative.    Respiratory:  Negative for cough and shortness of breath.   Cardiovascular:  Negative for leg swelling.  Gastrointestinal:  Negative for constipation.  Genitourinary: Negative.   Musculoskeletal:  Positive for back pain. Negative for arthralgias, gait problem and myalgias.  Skin: Negative.   Neurological:  Negative for dizziness and weakness.  Psychiatric/Behavioral:  Negative for confusion, dysphoric mood and sleep disturbance. The patient is nervous/anxious.    Health Maintenance  Topic Date Due   COVID-19 Vaccine (5 - Booster for Moderna series) 02/14/2022 (Originally 06/04/2021)   Zoster Vaccines- Shingrix (2 of 2) 05/13/2024 (Originally 10/21/2018)   TETANUS/TDAP  02/15/2024   Pneumonia Vaccine 23+ Years old  Completed   INFLUENZA VACCINE  Completed   DEXA SCAN  Completed   HPV VACCINES  Aged Out    Physical Exam: Vitals:   08/14/21 1412  BP: 140/70  Pulse: 69  Temp: (!) 97.1 F (36.2 C)  SpO2: 99%  Weight: 99 lb 4.8 oz (45 kg)  Height: 4\' 11"  (1.499 m)   Body mass index is 20.06 kg/m. Physical Exam Vitals reviewed.  Constitutional:      Appearance: Normal appearance.  HENT:     Head: Normocephalic.     Nose: Nose normal.     Mouth/Throat:     Mouth: Mucous membranes are moist.  Pharynx: Oropharynx is clear.  Eyes:     Pupils: Pupils are equal, round, and reactive to light.  Cardiovascular:     Rate and Rhythm: Normal rate and regular rhythm.     Pulses: Normal pulses.     Heart sounds: Normal heart sounds. No murmur heard. Pulmonary:     Effort: Pulmonary effort is normal.     Breath sounds: Normal breath sounds.  Abdominal:     General: Abdomen is flat. Bowel sounds are normal.     Palpations: Abdomen is soft.  Musculoskeletal:        General: No swelling.     Cervical back: Neck supple.  Skin:    General: Skin is warm.  Neurological:     General: No focal deficit present.     Mental Status: She is alert and oriented  to person, place, and time.  Psychiatric:        Mood and Affect: Mood normal.        Thought Content: Thought content normal.    Labs reviewed: Basic Metabolic Panel: Recent Labs    01/04/21 0952 03/23/21 1930 04/03/21 0859 07/04/21 0800  NA 142 138 142 141  K 4.0 4.9 4.4 4.2  CL 103 103 104 102  CO2 31 26 30 30   GLUCOSE 78 128* 80 69  BUN 17 25* 14 19  CREATININE 0.79 0.87 0.74 0.75  CALCIUM 9.5 9.3 9.4 10.0  TSH 2.51  --   --   --    Liver Function Tests: Recent Labs    03/23/21 1930 04/03/21 0859 07/04/21 0800  AST 30 23 23   ALT 21 16 14   ALKPHOS 36*  --   --   BILITOT 0.6 0.3 0.5  PROT 5.9* 6.2 7.1  ALBUMIN 3.7  --   --    No results for input(s): LIPASE, AMYLASE in the last 8760 hours. No results for input(s): AMMONIA in the last 8760 hours. CBC: Recent Labs    03/23/21 2026 04/03/21 0859 07/04/21 0800  WBC 9.7 5.9 7.9  NEUTROABS 7.5 3,357 5,017  HGB 11.5* 11.6* 14.0  HCT 36.1 35.1 43.5  MCV 95.0 94.4 88.6  PLT 232 354 319   Lipid Panel: Recent Labs    01/04/21 0952 07/04/21 0800  CHOL 204* 223*  HDL 74 84  LDLCALC 107* 116*  TRIG 124 122  CHOLHDL 2.8 2.7   No results found for: HGBA1C  Procedures since last visit: No results found.  Assessment/Plan 1. Essential hypertension Check BP Q weekly with Nurse Follow up in 8 weeks Diet and Exercise 2. Chronic midline low back pain without sciatica Has Appointment with Ortho   3. Osteoporosis, unspecified osteoporosis type, unspecified pathological fracture presence On Prolia  4. Hyperlipidemia, unspecified hyperlipidemia type Labs revived Does not want to start Statin right now  Bleeding from varicose vein No Recent Episodes  Asymptomatic PVCs   Exudative age-related macular degeneration of right eye with active choroidal neovascularization ([place])  Labs/tests ordered:  * No order type specified * Next appt:  11/28/2021

## 2021-08-19 DIAGNOSIS — M48061 Spinal stenosis, lumbar region without neurogenic claudication: Secondary | ICD-10-CM | POA: Insufficient documentation

## 2021-08-19 DIAGNOSIS — M51369 Other intervertebral disc degeneration, lumbar region without mention of lumbar back pain or lower extremity pain: Secondary | ICD-10-CM | POA: Insufficient documentation

## 2021-08-27 ENCOUNTER — Encounter (INDEPENDENT_AMBULATORY_CARE_PROVIDER_SITE_OTHER): Payer: Medicare Other | Admitting: Ophthalmology

## 2021-09-03 ENCOUNTER — Other Ambulatory Visit: Payer: Self-pay

## 2021-09-03 ENCOUNTER — Ambulatory Visit (INDEPENDENT_AMBULATORY_CARE_PROVIDER_SITE_OTHER): Payer: Medicare Other | Admitting: Ophthalmology

## 2021-09-03 ENCOUNTER — Encounter (INDEPENDENT_AMBULATORY_CARE_PROVIDER_SITE_OTHER): Payer: Self-pay | Admitting: Ophthalmology

## 2021-09-03 DIAGNOSIS — H353211 Exudative age-related macular degeneration, right eye, with active choroidal neovascularization: Secondary | ICD-10-CM | POA: Diagnosis not present

## 2021-09-03 DIAGNOSIS — H353222 Exudative age-related macular degeneration, left eye, with inactive choroidal neovascularization: Secondary | ICD-10-CM | POA: Diagnosis not present

## 2021-09-03 DIAGNOSIS — H353212 Exudative age-related macular degeneration, right eye, with inactive choroidal neovascularization: Secondary | ICD-10-CM

## 2021-09-03 NOTE — Assessment & Plan Note (Signed)
No sign of CNVM or formation OS today ?

## 2021-09-03 NOTE — Assessment & Plan Note (Signed)
Now some 10 months post most recent therapy stable no recurrences ?

## 2021-09-03 NOTE — Progress Notes (Signed)
? ? ?09/03/2021 ? ?  ? ?CHIEF COMPLAINT ?Patient presents for  ?Chief Complaint  ?Patient presents with  ? Macular Degeneration  ? ? ? ? ?HISTORY OF PRESENT ILLNESS: ?Julia Hinton is a 88 y.o. female who presents to the clinic today for:  ? ?HPI   ?6 mos fu ou oct fp. ?Patient states vision is stable and unchanged since last visit. Denies any new floaters or FOL. ? ?Last edited by Nelva Nay on 09/03/2021  2:22 PM.  ?  ? ? ?Referring physician: ?Mahlon Gammon, MD ?[address] ?[place],  [place] [address] ? ?HISTORICAL INFORMATION:  ? ?Selected notes from the MEDICAL RECORD NUMBER ?  ?   ? ?CURRENT MEDICATIONS: ?No current outpatient medications on file. (Ophthalmic Drugs)  ? ?No current facility-administered medications for this visit. (Ophthalmic Drugs)  ? ?Current Outpatient Medications (Other)  ?Medication Sig  ? acetaminophen (TYLENOL) 500 MG tablet Take 1,000 mg by mouth 2 (two) times daily.  ? Cholecalciferol 1000 units tablet Take 1,000 Units by mouth daily.  ? denosumab (PROLIA) 60 MG/ML SOSY injection Inject 60 mg into the skin every 6 (six) months.  ? Multiple Vitamins-Calcium (VIACTIV MULTI-VITAMIN) CHEW Chew 2 tablets by mouth daily.  ? ?No current facility-administered medications for this visit. (Other)  ? ? ? ? ?REVIEW OF SYSTEMS: ? ? ? ?ALLERGIES ?Allergies  ?Allergen Reactions  ? Sulfa Antibiotics   ? ? ?PAST MEDICAL HISTORY ?Past Medical History:  ?Diagnosis Date  ? Osteoporosis   ? PVC (premature ventricular contraction)   ? Vitamin D deficiency   ? ?Past Surgical History:  ?Procedure Laterality Date  ? TUBAL LIGATION  1969  ? ? ?FAMILY HISTORY ?Family History  ?Problem Relation Age of Onset  ? Dementia Mother   ? Osteoporosis Mother   ? Cancer Sister   ? Breast cancer Sister   ? ? ?SOCIAL HISTORY ?Social History  ? ?Tobacco Use  ? Smoking status: Never  ? Smokeless tobacco: Never  ?Vaping Use  ? Vaping Use: Never used  ?Substance Use Topics  ? Alcohol use: Yes  ?  Alcohol/week: 2.0 standard  drinks  ?  Types: 2 Glasses of wine per week  ? Drug use: No  ? ?  ? ?  ? ?OPHTHALMIC EXAM: ? ?Base Eye Exam   ? ? Visual Acuity (ETDRS)   ? ?   Right Left  ? Dist cc HM 20/20  ? ? Correction: Glasses  ? ?  ?  ? ? Tonometry (Tonopen, 2:25 PM)   ? ?   Right Left  ? Pressure 19 15  ? ?  ?  ? ? Pupils   ? ?   Pupils Dark Light APD  ? Right PERRL 5 4 None  ? Left PERRL 5 4 None  ? ?  ?  ? ? Visual Fields (Counting fingers)   ? ?   Left Right  ?  Full   ? Restrictions  Total inferior temporal deficiency; Partial inner superior temporal, superior nasal, inferior nasal deficiencies  ? ?  ?  ? ? Extraocular Movement   ? ?   Right Left  ?  Full Full  ? ?  ?  ? ? Neuro/Psych   ? ? Oriented x3: Yes  ? Mood/Affect: Normal  ? ?  ?  ? ? Dilation   ? ? Both eyes: 1.0% Mydriacyl, 2.5% Phenylephrine @ 2:25 PM  ? ?  ?  ? ?  ? ?Slit Lamp and  Fundus Exam   ? ? External Exam   ? ?   Right Left  ? External Normal Normal  ? ?  ?  ? ? Slit Lamp Exam   ? ?   Right Left  ? Lids/Lashes Normal Normal  ? Conjunctiva/Sclera White and quiet White and quiet  ? Cornea Clear Clear  ? Anterior Chamber Deep and quiet Deep and quiet  ? Iris Round and reactive Round and reactive  ? Lens Posterior chamber intraocular lens Posterior chamber intraocular lens  ? Anterior Vitreous Normal Normal  ? ?  ?  ? ? Fundus Exam   ? ?   Right Left  ? Posterior Vitreous Posterior vitreous detachment Posterior vitreous detachment  ? Disc Normal Normal  ? C/D Ratio 0.3 0.5  ? Macula Disciform scar white fibrotic,, and submacular area 7 disc areas in size no active edges currently.  Intermediate age related macular degeneration, no macular thickening, no hemorrhage, no exudates  ? Vessels Normal Normal  ? Periphery Normal Normal  ? ?  ?  ? ?  ? ? ?IMAGING AND PROCEDURES  ?Imaging and Procedures for 09/03/21 ? ?OCT, Retina - OU - Both Eyes   ? ?   ?Right Eye ?Quality was borderline. Scan locations included subfoveal. Central Foveal Thickness: 664. Progression has been  stable. Findings include no IRF, no SRF, disciform scar.  ? ?Left Eye ?Quality was good. Scan locations included subfoveal. Central Foveal Thickness: 285. Progression has been stable. Findings include abnormal foveal contour, no IRF, no SRF.  ? 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  No signs of active lesion, no active intraretinal fluid ? ?OS no signs of CNVM ? ?  ? ?Color Fundus Photography Optos - OU - Both Eyes   ? ?   ?Right Eye ?Progression has improved. Disc findings include normal observations. Vessels : normal observations. Periphery : normal observations.  ? ?Left Eye ?Progression has been stable. Disc findings include normal observations. Macula : normal observations. Vessels : normal observations. Periphery : normal observations.  ? ?Notes ?Macular disciform scar centrally, now at 6 months post recent examination and 10 months post therapy OD. Original massive lesion larger and clinicians macula by magnification of 3, now completely involutional.  Pigmentary change demarcates prior massive subretinal hemorrhage. ? ?OS with low-grade early ARMD findings drusen no other complication ? ?  ? ? ?  ?  ? ?  ?ASSESSMENT/PLAN: ? ?Exudative age-related macular degeneration of left eye with inactive choroidal neovascularization (HCC) ?No sign of CNVM or formation OS today ? ?Exudative age-related macular degeneration of right eye with inactive choroidal neovascularization (HCC) ?Now some 10 months post most recent therapy stable no recurrences  ? ?  ICD-10-CM   ?1. Exudative age-related macular degeneration of right eye with active choroidal neovascularization (HCC)  H35.3211 OCT, Retina - OU - Both Eyes  ?  Color Fundus Photography Optos - OU - Both Eyes  ?  ?2. Exudative age-related macular degeneration of left eye with inactive choroidal neovascularization (HCC)  H35.3222   ?  ?3. Exudative age-related macular  degeneration of right eye with inactive choroidal neovascularization (HCC)  H35.3212   ?  ? ? ?1.  OU, stable, no sign of CNVM formation developing OS and no sign of recurrence OD ? ?2. ? ?3. ? ?Ophthalmic Meds Ordered this visit:  ?No orders of the defined types were placed in this encounter. ? ? ?  ? ?  Return in about 6 months (around 03/06/2022) for DILATE OU, COLOR FP, OCT. ? ?There are no Patient Instructions on file for this visit. ? ? ?Explained the diagnoses, plan, and follow up with the patient and they expressed understanding.  Patient expressed understanding of the importance of proper follow up care.  ? ?Alford HighlandGary A. Arihant Pennings M.D. ?Diseases & Surgery of the Retina and Vitreous ?[facility] ?09/03/21 ? ? ? ? ?Abbreviations: ?M myopia (nearsighted); A astigmatism; H hyperopia (farsighted); P presbyopia; Mrx spectacle prescription;  CTL contact lenses; OD right eye; OS left eye; OU both eyes  XT exotropia; ET esotropia; PEK punctate epithelial keratitis; PEE punctate epithelial erosions; DES dry eye syndrome; MGD meibomian gland dysfunction; ATs artificial tears; PFAT's preservative free artificial tears; NSC nuclear sclerotic cataract; PSC posterior subcapsular cataract; ERM epi-retinal membrane; PVD posterior vitreous detachment; RD retinal detachment; DM diabetes mellitus; DR diabetic retinopathy; NPDR non-proliferative diabetic retinopathy; PDR proliferative diabetic retinopathy; CSME clinically significant macular edema; DME diabetic macular edema; dbh dot blot hemorrhages; CWS cotton wool spot; POAG primary open angle glaucoma; C/D cup-to-disc ratio; HVF humphrey visual field; GVF goldmann visual field; OCT optical coherence tomography; IOP intraocular pressure; BRVO Branch retinal vein occlusion; CRVO central retinal vein occlusion; CRAO central retinal artery occlusion; BRAO branch retinal artery occlusion; RT retinal tear; SB scleral buckle; PPV pars plana vitrectomy; VH Vitreous  hemorrhage; PRP panretinal laser photocoagulation; IVK intravitreal kenalog; VMT vitreomacular traction; MH Macular hole;  NVD neovascularization of the disc; NVE neovascularization elsewhere; AREDS age related eye disease

## 2021-09-09 ENCOUNTER — Other Ambulatory Visit: Payer: Self-pay | Admitting: Obstetrics and Gynecology

## 2021-09-09 ENCOUNTER — Telehealth: Payer: Self-pay | Admitting: Internal Medicine

## 2021-09-09 DIAGNOSIS — Z1231 Encounter for screening mammogram for malignant neoplasm of breast: Secondary | ICD-10-CM

## 2021-09-09 NOTE — Telephone Encounter (Signed)
Pt states she needs a call made to "[facility]" and requests a call back from Hollis at [phone] /pt is unhappy with the referral as it was to the wrong specialist/states she was sent to a surgeon.  ?

## 2021-09-19 ENCOUNTER — Telehealth: Payer: Self-pay

## 2021-09-19 NOTE — Telephone Encounter (Signed)
Left message for patient to call back to schedule a Medicare AWV with Amy Fargo, NP.  ?

## 2021-09-20 NOTE — Telephone Encounter (Signed)
Schedule Medicare AWV with Amy Fargo, NP at [facility] ?

## 2021-10-09 ENCOUNTER — Encounter: Payer: Medicare Other | Admitting: Internal Medicine

## 2021-10-16 ENCOUNTER — Encounter: Payer: Self-pay | Admitting: Internal Medicine

## 2021-10-16 ENCOUNTER — Encounter: Payer: Medicare Other | Admitting: Internal Medicine

## 2021-10-19 NOTE — Progress Notes (Signed)
No SHow

## 2021-10-21 ENCOUNTER — Ambulatory Visit
Admission: RE | Admit: 2021-10-21 | Discharge: 2021-10-21 | Disposition: A | Discharge status: Home / Self Care | Payer: Medicare Other | Source: Ambulatory Visit | Attending: Obstetrics and Gynecology | Admitting: Obstetrics and Gynecology

## 2021-10-21 DIAGNOSIS — Z1231 Encounter for screening mammogram for malignant neoplasm of breast: Secondary | ICD-10-CM

## 2021-11-27 ENCOUNTER — Ambulatory Visit: Payer: Medicare Other

## 2021-11-28 ENCOUNTER — Ambulatory Visit (INDEPENDENT_AMBULATORY_CARE_PROVIDER_SITE_OTHER): Payer: Medicare Other

## 2021-11-28 DIAGNOSIS — M810 Age-related osteoporosis without current pathological fracture: Secondary | ICD-10-CM | POA: Diagnosis not present

## 2021-11-28 MED ORDER — DENOSUMAB 60 MG/ML ~~LOC~~ SOSY
60.0000 mg | PREFILLED_SYRINGE | Freq: Once | SUBCUTANEOUS | Status: AC
  Administered 2021-11-28: 60 mg via SUBCUTANEOUS

## 2021-12-02 ENCOUNTER — Encounter: Payer: Medicare Other | Admitting: Orthopedic Surgery

## 2021-12-02 ENCOUNTER — Encounter: Payer: Self-pay | Admitting: Orthopedic Surgery

## 2021-12-03 NOTE — Progress Notes (Signed)
This encounter was created in error - please disregard.

## 2022-01-02 DIAGNOSIS — M810 Age-related osteoporosis without current pathological fracture: Secondary | ICD-10-CM

## 2022-01-02 DIAGNOSIS — I83899 Varicose veins of unspecified lower extremities with other complications: Secondary | ICD-10-CM

## 2022-01-02 DIAGNOSIS — I493 Ventricular premature depolarization: Secondary | ICD-10-CM

## 2022-01-02 DIAGNOSIS — E785 Hyperlipidemia, unspecified: Secondary | ICD-10-CM

## 2022-01-03 LAB — CBC WITH DIFFERENTIAL/PLATELET
Absolute Monocytes: 581 cells/uL (ref 200–950)
Basophils Absolute: 63 cells/uL (ref 0–200)
Basophils Relative: 0.9 %
Eosinophils Absolute: 140 cells/uL (ref 15–500)
Eosinophils Relative: 2 %
HCT: 43.7 % (ref 35.0–45.0)
Hemoglobin: 14.4 g/dL (ref 11.7–15.5)
Lymphs Abs: 1785 cells/uL (ref 850–3900)
MCH: 30.6 pg (ref 27.0–33.0)
MCHC: 33 g/dL (ref 32.0–36.0)
MCV: 92.8 fL (ref 80.0–100.0)
MPV: 10.6 fL (ref 7.5–12.5)
Monocytes Relative: 8.3 %
Neutro Abs: 4431 cells/uL (ref 1500–7800)
Neutrophils Relative %: 63.3 %
Platelets: 319 10*3/uL (ref 140–400)
RBC: 4.71 10*6/uL (ref 3.80–5.10)
RDW: 13 % (ref 11.0–15.0)
Total Lymphocyte: 25.5 %
WBC: 7 10*3/uL (ref 3.8–10.8)

## 2022-01-03 LAB — COMPLETE METABOLIC PANEL WITH GFR
AG Ratio: 2.1 (calc) (ref 1.0–2.5)
ALT: 15 U/L (ref 6–29)
AST: 22 U/L (ref 10–35)
Albumin: 4.6 g/dL (ref 3.6–5.1)
Alkaline phosphatase (APISO): 39 U/L (ref 37–153)
BUN: 19 mg/dL (ref 7–25)
CO2: 30 mmol/L (ref 20–32)
Calcium: 9.6 mg/dL (ref 8.6–10.4)
Chloride: 102 mmol/L (ref 98–110)
Creat: 0.72 mg/dL (ref 0.60–0.95)
Globulin: 2.2 g/dL (calc) (ref 1.9–3.7)
Glucose, Bld: 64 mg/dL — ABNORMAL LOW (ref 65–99)
Potassium: 4.2 mmol/L (ref 3.5–5.3)
Sodium: 140 mmol/L (ref 135–146)
Total Bilirubin: 0.5 mg/dL (ref 0.2–1.2)
Total Protein: 6.8 g/dL (ref 6.1–8.1)
eGFR: 81 mL/min/{1.73_m2} (ref >=60)

## 2022-01-03 LAB — LIPID PANEL
Cholesterol: 233 mg/dL — ABNORMAL HIGH (ref <200)
HDL: 74 mg/dL (ref >=50)
LDL Cholesterol (Calc): 135 mg/dL (calc) — ABNORMAL HIGH
Non-HDL Cholesterol (Calc): 159 mg/dL (calc) — ABNORMAL HIGH (ref <130)
Total CHOL/HDL Ratio: 3.1 (calc) (ref <5.0)
Triglycerides: 127 mg/dL (ref <150)

## 2022-01-03 LAB — TSH: TSH: 1.37 mIU/L (ref 0.40–4.50)

## 2022-01-08 ENCOUNTER — Non-Acute Institutional Stay: Payer: Medicare Other | Admitting: Internal Medicine

## 2022-01-08 ENCOUNTER — Encounter: Payer: Self-pay | Admitting: Internal Medicine

## 2022-01-08 VITALS — BP 158/61 | HR 60 | Temp 96.9°F | Ht 59.0 in | Wt 96.1 lb

## 2022-01-08 DIAGNOSIS — H353211 Exudative age-related macular degeneration, right eye, with active choroidal neovascularization: Secondary | ICD-10-CM

## 2022-01-08 DIAGNOSIS — M810 Age-related osteoporosis without current pathological fracture: Secondary | ICD-10-CM

## 2022-01-08 DIAGNOSIS — E785 Hyperlipidemia, unspecified: Secondary | ICD-10-CM | POA: Diagnosis not present

## 2022-01-08 DIAGNOSIS — I10 Essential (primary) hypertension: Secondary | ICD-10-CM

## 2022-01-08 DIAGNOSIS — G8929 Other chronic pain: Secondary | ICD-10-CM

## 2022-01-08 DIAGNOSIS — I83899 Varicose veins of unspecified lower extremities with other complications: Secondary | ICD-10-CM

## 2022-01-08 DIAGNOSIS — M545 Low back pain, unspecified: Secondary | ICD-10-CM

## 2022-01-08 DIAGNOSIS — I493 Ventricular premature depolarization: Secondary | ICD-10-CM

## 2022-01-08 NOTE — Progress Notes (Signed)
Location:  [facility]:  Clinic ([id number])  Provider:   Code Status:  Goals of Care:     12/02/2021   10:29 AM  Advanced Directives  Does Patient Have a Medical Advance Directive? No  Does patient want to make changes to medical advance directive? No - Patient declined     Chief Complaint  Patient presents with   Medical Management of Chronic Issues    Patient returns to the clinic for her 6 month follow up.     HPI: Patient is a 88 y.o. female seen today for medical management of chronic diseases.    Patient has history Osteoporosis and Right Eye Macular degeneration and Vit D Def  Recent Low back Pain due to L1-5 Severe Stenosis  Low back pain Saw Dr Ethelene Hal Got Spinal block twice Not candidate for Lumbar Fusion Continue sot have pain  Comes usually when she is doing house work  Sleeping well. No Sciatica No Red flag symptoms Celebrex per Ortho did not work for her Tylenol seems to be helping  Hypertension Her blood pressure is elevated again today per patient she has been checking it at home and it has been good.  She has been eating out a lot and she does not like the food of [facility].    Past Medical History:  Diagnosis Date   Osteoporosis    PVC (premature ventricular contraction)    Vitamin D deficiency     Past Surgical History:  Procedure Laterality Date   TUBAL LIGATION  1969    Allergies  Allergen Reactions   Sulfa Antibiotics     Outpatient Encounter Medications as of 01/08/2022  Medication Sig   acetaminophen (TYLENOL) 500 MG tablet Take 1,000 mg by mouth 2 (two) times daily.   Cholecalciferol 1000 units tablet Take 1,000 Units by mouth daily.   denosumab (PROLIA) 60 MG/ML SOSY injection Inject 60 mg into the skin every 6 (six) months.   Multiple Vitamins-Calcium (VIACTIV MULTI-VITAMIN) CHEW Chew 2 tablets by mouth daily.   No facility-administered encounter medications on file as of 01/08/2022.    Review of  Systems:  Review of Systems  Constitutional:  Positive for activity change. Negative for appetite change.  HENT: Negative.    Respiratory:  Negative for cough and shortness of breath.   Cardiovascular:  Negative for leg swelling.  Gastrointestinal:  Negative for constipation.  Genitourinary: Negative.   Musculoskeletal:  Positive for back pain. Negative for arthralgias, gait problem and myalgias.  Skin: Negative.   Neurological:  Negative for dizziness and weakness.  Psychiatric/Behavioral:  Negative for confusion, dysphoric mood and sleep disturbance.     Health Maintenance  Topic Date Due   COVID-19 Vaccine (5 - Moderna series) 02/14/2022 (Originally 06/04/2021)   Zoster Vaccines- Shingrix (2 of 2) 05/13/2024 (Originally 10/21/2018)   INFLUENZA VACCINE  01/21/2022   TETANUS/TDAP  02/15/2024   Pneumonia Vaccine 23+ Years old  Completed   DEXA SCAN  Completed   HPV VACCINES  Aged Out    Physical Exam: Vitals:   01/08/22 1401  BP: (!) 175/62  Pulse: 60  Temp: (!) 96.9 F (36.1 C)  SpO2: 98%  Weight: 96 lb 1.6 oz (43.6 kg)  Height: 4\' 11"  (1.499 m)   Body mass index is 19.41 kg/m. Physical Exam Vitals reviewed.  Constitutional:      Appearance: Normal appearance.  HENT:     Head: Normocephalic.     Right Ear: Tympanic membrane  normal.     Left Ear: Tympanic membrane normal.     Nose: Nose normal.     Mouth/Throat:     Mouth: Mucous membranes are moist.     Pharynx: Oropharynx is clear.  Eyes:     Pupils: Pupils are equal, round, and reactive to light.  Cardiovascular:     Rate and Rhythm: Normal rate and regular rhythm.     Pulses: Normal pulses.     Heart sounds: Normal heart sounds. No murmur heard. Pulmonary:     Effort: Pulmonary effort is normal.     Breath sounds: Normal breath sounds.  Abdominal:     General: Abdomen is flat. Bowel sounds are normal.     Palpations: Abdomen is soft.  Musculoskeletal:        General: No swelling.     Cervical back:  Neck supple.  Skin:    General: Skin is warm.  Neurological:     General: No focal deficit present.     Mental Status: She is alert and oriented to person, place, and time.  Psychiatric:        Mood and Affect: Mood normal.        Thought Content: Thought content normal.     Labs reviewed: Basic Metabolic Panel: Recent Labs    04/03/21 0859 07/04/21 0800 01/02/22 0830  NA 142 141 140  K 4.4 4.2 4.2  CL 104 102 102  CO2 30 30 30   GLUCOSE 80 69 64*  BUN 14 19 19   CREATININE 0.74 0.75 0.72  CALCIUM 9.4 10.0 9.6  TSH  --   --  1.37   Liver Function Tests: Recent Labs    03/23/21 1930 04/03/21 0859 07/04/21 0800 01/02/22 0830  AST 30 23 23 22   ALT 21 16 14 15   ALKPHOS 36*  --   --   --   BILITOT 0.6 0.3 0.5 0.5  PROT 5.9* 6.2 7.1 6.8  ALBUMIN 3.7  --   --   --    No results for input(s): "LIPASE", "AMYLASE" in the last 8760 hours. No results for input(s): "AMMONIA" in the last 8760 hours. CBC: Recent Labs    04/03/21 0859 07/04/21 0800 01/02/22 0830  WBC 5.9 7.9 7.0  NEUTROABS 3,357 5,017 4,431  HGB 11.6* 14.0 14.4  HCT 35.1 43.5 43.7  MCV 94.4 88.6 92.8  PLT 354 319 319   Lipid Panel: Recent Labs    07/04/21 0800 01/02/22 0830  CHOL 223* 233*  HDL 84 74  LDLCALC 116* 135*  TRIG 122 127  CHOLHDL 2.7 3.1   No results found for: "HGBA1C"  Procedures since last visit: No results found.  Assessment/Plan 1. Essential hypertension BP did come down She continues to eat high salt food  Says her BP was normal at home Given Info for Dash Diet If continues to be elevated consider Adding Med  2. 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 Osteoporosis, unspecified osteoporosis type, unspecified pathological fracture presence Repeat DEXA ordered On  Prolia  4. Hyperlipidemia, unspecified hyperlipidemia type LDL has gone up Eating Fast Food 2/week with red meat Discussed Diet modification HDL good levels  5. Bleeding from varicose vein None Recently  6. Asymptomatic PVCs   7. Exudative age-related macular degeneration of right eye with  active choroidal neovascularization (HCC)     Labs/tests ordered:  * No order type specified * Next appt:  06/04/2022

## 2022-01-08 NOTE — Patient Instructions (Signed)
You can use Over the counter Lidocaine patches/ Salon Pas once a day to help with pain. Voltaren Gel helps too Cut back on your salt intake and Red meat I have ordered Bone density in Breast cancer You can call and go there anytime

## 2022-01-28 ENCOUNTER — Ambulatory Visit (INDEPENDENT_AMBULATORY_CARE_PROVIDER_SITE_OTHER): Payer: Medicare Other | Admitting: Podiatry

## 2022-01-28 DIAGNOSIS — M79674 Pain in right toe(s): Secondary | ICD-10-CM

## 2022-01-28 DIAGNOSIS — M79675 Pain in left toe(s): Secondary | ICD-10-CM

## 2022-01-28 DIAGNOSIS — B351 Tinea unguium: Secondary | ICD-10-CM | POA: Diagnosis not present

## 2022-01-28 NOTE — Progress Notes (Signed)
Subjective:   Patient ID: Julia Hinton, female   DOB: 88 y.o.   MRN: [id number]   HPI Chief Complaint  Patient presents with   Nail Problem    Pt would like to talk about right hallux removal, pt has a wedding to go to in 2 weeks and would like a nail trim and come back for the nail removal, pt has bilateral nail fungus, thick and yellow nails  sept 53   88 year old female presents with above complaints.  She states that she would like to have her right big toenail removed however she has an upcoming wedding in a couple weeks.  She states that it is hard to trim and it rubs the second toe.  In general nails are thickened discolored.  Sharply trimmed today and consider nail removal after the upcoming wedding.    Review of Systems  All other systems reviewed and are negative.  Past Medical History:  Diagnosis Date   Osteoporosis    PVC (premature ventricular contraction)    Vitamin D deficiency     Past Surgical History:  Procedure Laterality Date   TUBAL LIGATION  1969     Current Outpatient Medications:    acetaminophen (TYLENOL) 500 MG tablet, Take 1,000 mg by mouth 2 (two) times daily., Disp: , Rfl:    Cholecalciferol 1000 units tablet, Take 1,000 Units by mouth daily., Disp: , Rfl:    denosumab (PROLIA) 60 MG/ML SOSY injection, Inject 60 mg into the skin every 6 (six) months., Disp: 180 mL, Rfl: 0   Multiple Vitamins-Calcium (VIACTIV MULTI-VITAMIN) CHEW, Chew 2 tablets by mouth daily., Disp: , Rfl:   Allergies  Allergen Reactions   Sulfa Antibiotics           Objective:  Physical Exam  General: AAO x3, NAD  Dermatological: Left 1st and 2nd toenail removed previously. The other nails are hypertrophic, dystrophic toenails 3 through 5 on the left and 1 through 5 on the right most notably the right hallux.  No edema, erythema or signs of infection.  There is no open lesions.  Vascular: Dorsalis Pedis artery and Posterior Tibial artery pedal pulses are 2/4 bilateral  with immedate capillary fill time.  There is no pain with calf compression, swelling, warmth, erythema.   Neruologic: Grossly intact via light touch bilateral.   Musculoskeletal: Muscular strength 5/5 in all groups tested bilateral.  Gait: Unassisted, Nonantalgic.       Assessment:   Symptomatic onychomycosis     Plan:  -Treatment options discussed including all alternatives, risks, and complications -Etiology of symptoms were discussed -Nails debrided 8 without complications or bleeding. -We discussed right hallux nail removal.  She may consider this in September.  Discussed the procedure as well as postoperative course. -Daily foot inspection -Follow-up in 3 months or sooner if any problems arise. In the meantime, encouraged to call the office with any questions, concerns, change in symptoms.   Ovid Curd, DPM

## 2022-02-25 ENCOUNTER — Ambulatory Visit (INDEPENDENT_AMBULATORY_CARE_PROVIDER_SITE_OTHER): Payer: Medicare Other | Admitting: Podiatry

## 2022-02-25 DIAGNOSIS — L603 Nail dystrophy: Secondary | ICD-10-CM | POA: Diagnosis not present

## 2022-02-25 DIAGNOSIS — L600 Ingrowing nail: Secondary | ICD-10-CM

## 2022-02-25 NOTE — Patient Instructions (Signed)

## 2022-02-25 NOTE — Progress Notes (Signed)
Subjective: Chief Complaint  Patient presents with   Ingrown Toenail    Rm 14 Right bilateral hallux ingrown. Pt states pains has been off and on for a few years. Soreness, redness. No bleeding or drainage.    88 year old female the above concerns.  She was at the big toenail removed permanently.  Appears to the left hallux and second digit nail removed.  She states that the second toe on the right foot overlaps the big toe causing pressure and pain she has noticed her nails become thicker over time.  No swelling redness or any drainage.  Objective: AAO x3, NAD DP/PT pulses palpable bilaterally, CRT less than 3 seconds Right hallux nail is hypertrophic, dystrophic with yellow, brown discoloration and incurvation of the nail border.  The second toe overlaps the hallux.  No edema, erythema or signs of infection. No pain with calf compression, swelling, warmth, erythema  Assessment: Ingrown toenail, onychodystrophy right hallux toenail  Plan: -All treatment options discussed with the patient including all alternatives, risks, complications.  -At this time, the patient is requesting total nail removal with chemical matricectomy to the symptomatic portion of the nail. Risks and complications were discussed with the patient for which they understand and written consent was obtained. Under sterile conditions a total of 3 mL of a mixture of 2% lidocaine plain and 0.5% Marcaine plain was infiltrated in a hallux block fashion. Once anesthetized, the skin was prepped in sterile fashion. A tourniquet was then applied. Next the right hallux nail was removed in total making sure remove all nail borders.  Once the nails were ensured to be removed area was debrided and the underlying skin was intact. There is no purulence identified in the procedure. Next phenol was then applied under standard conditions and copiously irrigated.  Antibiotic ointment was applied. A dry sterile dressing was applied. After  application of the dressing the tourniquet was removed and there is found to be an immediate capillary refill time to the digit. The patient tolerated the procedure well any complications. Post procedure instructions were discussed the patient for which he verbally understood. . Discussed signs/symptoms of infection and directed to call the office immediately should any occur or go directly to the emergency room. In the meantime, encouraged to call the office with any questions, concerns, changes symptoms. -Patient encouraged to call the office with any questions, concerns, change in symptoms.   Vivi Barrack DPM

## 2022-02-26 ENCOUNTER — Telehealth: Payer: Self-pay | Admitting: *Deleted

## 2022-02-26 NOTE — Telephone Encounter (Signed)
Patient is calling for clarification of soaking instructions,explained the soaking instructions to patient , verbalized understanding but wanted to know if it is ok to take a shower today if covering the toe w/ plastic bag? Please advise.

## 2022-02-28 NOTE — Telephone Encounter (Signed)
Spoke with patient giving physician's instructions, verbalized understanding

## 2022-03-06 ENCOUNTER — Encounter (INDEPENDENT_AMBULATORY_CARE_PROVIDER_SITE_OTHER): Payer: Self-pay | Admitting: Ophthalmology

## 2022-03-06 ENCOUNTER — Ambulatory Visit (INDEPENDENT_AMBULATORY_CARE_PROVIDER_SITE_OTHER): Payer: Medicare Other | Admitting: Ophthalmology

## 2022-03-06 DIAGNOSIS — H353121 Nonexudative age-related macular degeneration, left eye, early dry stage: Secondary | ICD-10-CM

## 2022-03-06 DIAGNOSIS — H353212 Exudative age-related macular degeneration, right eye, with inactive choroidal neovascularization: Secondary | ICD-10-CM

## 2022-03-06 DIAGNOSIS — H35721 Serous detachment of retinal pigment epithelium, right eye: Secondary | ICD-10-CM

## 2022-03-06 DIAGNOSIS — H3561 Retinal hemorrhage, right eye: Secondary | ICD-10-CM | POA: Diagnosis not present

## 2022-03-06 DIAGNOSIS — H353211 Exudative age-related macular degeneration, right eye, with active choroidal neovascularization: Secondary | ICD-10-CM | POA: Diagnosis not present

## 2022-03-06 NOTE — Progress Notes (Signed)
03/06/2022     CHIEF COMPLAINT Patient presents for  Chief Complaint  Patient presents with   Macular Degeneration      HISTORY OF PRESENT ILLNESS: Julia Hinton is a 88 y.o. female who presents to the clinic today for:   HPI   6 MOS FOR DILATE OU, COLOR FP, OCT. Pt stated vision has remained stable since last visit. Pt denies new floaters and FOL.  Last edited by Angeline Slim on 03/06/2022  1:49 PM.      Referring physician: Ernesto Rutherford, MD [address] [place],  [place] [address]  HISTORICAL INFORMATION:   Selected notes from the MEDICAL RECORD NUMBER       CURRENT MEDICATIONS: No current outpatient medications on file. (Ophthalmic Drugs)   No current facility-administered medications for this visit. (Ophthalmic Drugs)   Current Outpatient Medications (Other)  Medication Sig   acetaminophen (TYLENOL) 500 MG tablet Take 1,000 mg by mouth 2 (two) times daily.   Cholecalciferol 1000 units tablet Take 1,000 Units by mouth daily.   denosumab (PROLIA) 60 MG/ML SOSY injection Inject 60 mg into the skin every 6 (six) months.   Multiple Vitamins-Calcium (VIACTIV MULTI-VITAMIN) CHEW Chew 2 tablets by mouth daily.   No current facility-administered medications for this visit. (Other)      REVIEW OF SYSTEMS: ROS   Negative for: Constitutional, Gastrointestinal, Neurological, Skin, Genitourinary, Musculoskeletal, HENT, Endocrine, Cardiovascular, Eyes, Respiratory, Psychiatric, Allergic/Imm, Heme/Lymph Last edited by Angeline Slim on 03/06/2022  1:50 PM.       ALLERGIES Allergies  Allergen Reactions   Sulfa Antibiotics     PAST MEDICAL HISTORY Past Medical History:  Diagnosis Date   Osteoporosis    PVC (premature ventricular contraction)    Vitamin D deficiency    Past Surgical History:  Procedure Laterality Date   TUBAL LIGATION  1969    FAMILY HISTORY Family History  Problem Relation Age of Onset   Dementia Mother    Osteoporosis Mother    Cancer  Sister    Breast cancer Sister     SOCIAL HISTORY Social History   Tobacco Use   Smoking status: Never   Smokeless tobacco: Never  Vaping Use   Vaping Use: Never used  Substance Use Topics   Alcohol use: Yes    Alcohol/week: 2.0 standard drinks of alcohol    Types: 2 Glasses of wine per week   Drug use: No         OPHTHALMIC EXAM:  Base Eye Exam     Visual Acuity (ETDRS)       Right Left   Dist cc HM 20/25    Correction: Glasses         Tonometry (Tonopen, 1:54 PM)       Right Left   Pressure 16 16         Pupils       Pupils Dark Light Shape React APD   Right PERRL 4 3 Round Brisk None   Left PERRL 4 3 Round Brisk None         Visual Fields       Left Right    Full    Restrictions  Total inferior temporal deficiency; Partial inner superior temporal, superior nasal, inferior nasal deficiencies         Extraocular Movement       Right Left    Full Full         Neuro/Psych     Oriented x3:  Yes   Mood/Affect: Normal         Dilation     Both eyes: 1.0% Mydriacyl, 2.5% Phenylephrine @ 1:54 PM           Slit Lamp and Fundus Exam     External Exam       Right Left   External Normal Normal         Slit Lamp Exam       Right Left   Lids/Lashes Normal Normal   Conjunctiva/Sclera White and quiet White and quiet   Cornea Clear Clear   Anterior Chamber Deep and quiet Deep and quiet   Iris Round and reactive Round and reactive   Lens Posterior chamber intraocular lens Posterior chamber intraocular lens   Anterior Vitreous Normal Normal         Fundus Exam       Right Left   Posterior Vitreous Posterior vitreous detachment Posterior vitreous detachment   Disc Normal Normal   C/D Ratio 0.3 0.5   Macula Disciform scar white fibrotic,, and submacular area 7 disc areas in size no active edges currently.  Intermediate age related macular degeneration, no macular thickening, no hemorrhage, no exudates   Vessels Normal  Normal   Periphery Normal Normal            IMAGING AND PROCEDURES  Imaging and Procedures for 03/06/22  OCT, Retina - OU - Both Eyes       Right Eye Quality was borderline. Scan locations included subfoveal. Central Foveal Thickness: 687. Progression has been stable. Findings include no IRF, no SRF, disciform scar.   Left Eye Quality was good. Scan locations included subfoveal. Central Foveal Thickness: 293. Progression has been stable. Findings include no IRF, no SRF, abnormal foveal contour.   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  No signs of active lesion, no active intraretinal fluid  OS no signs of CNVM      Color Fundus Photography Optos - OU - Both Eyes       Right Eye Progression has improved. Disc findings include normal observations. Macula : geographic atrophy, retinal pigment epithelium abnormalities. Vessels : normal observations. Periphery : normal observations.   Left Eye Progression has been stable. Disc findings include normal observations. Macula : normal observations. Vessels : normal observations. Periphery : normal observations.   Notes Macular disciform scar centrally, now at 6 months post recent examination and 18 months post therapy OD. Original massive lesion larger and clinicians macula by magnification of 3, now completely involutional.  Pigmentary change demarcates prior massive subretinal hemorrhage.  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 Exudative age-related macular degeneration of right eye with active choroidal neovascularization (HCC)  H35.3211 OCT, Retina - OU - Both Eyes    Color Fundus Photography Optos - OU - Both Eyes    2. Exudative age-related macular degeneration of right eye with inactive choroidal neovascularization (HCC)  H35.3212     3. Serous detachment of  retinal pigment epithelium of right eye  H35.721     4. Retinal hemorrhage of right eye  H35.61     5. 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  No interval change in vision otherwise stable.  No sign of CNVM developing left eye.  2.  Lesion OD, no sign of enlargement of scotoma.  Observe  3.  Ophthalmic Meds Ordered this visit:  No orders of the defined types were placed in this encounter.      Return in about 7 months (around 10/05/2022) for DILATE OU, COLOR FP, OCT.  There are no Patient Instructions on file for this visit.   Explained the diagnoses, plan, and follow up with the patient and they expressed understanding.  Patient expressed understanding of the importance of proper follow up care.   Alford Highland Onna Nodal M.D. Diseases & Surgery of the Retina and Vitreous [facility] 03/06/22     Abbreviations: M myopia (nearsighted); A astigmatism; H hyperopia (farsighted); P presbyopia; Mrx spectacle prescription;  CTL contact lenses; OD right eye; OS left eye; OU both eyes  XT exotropia; ET esotropia; PEK punctate epithelial keratitis; PEE punctate epithelial erosions; DES dry eye syndrome; MGD meibomian gland dysfunction; ATs artificial tears; PFAT's preservative free artificial tears; NSC nuclear sclerotic cataract; PSC posterior subcapsular cataract; ERM epi-retinal membrane; PVD posterior vitreous detachment; RD retinal detachment; DM diabetes mellitus; DR diabetic retinopathy; NPDR non-proliferative diabetic retinopathy; PDR proliferative diabetic  retinopathy; CSME clinically significant macular edema; DME diabetic macular edema; dbh dot blot hemorrhages; CWS cotton wool spot; POAG primary open angle glaucoma; C/D cup-to-disc ratio; HVF humphrey visual field; GVF goldmann visual field; OCT optical coherence tomography; IOP intraocular pressure; BRVO Branch retinal vein occlusion; CRVO central retinal vein occlusion; CRAO central retinal artery occlusion; BRAO branch retinal artery occlusion; RT retinal tear; SB scleral buckle; PPV pars plana vitrectomy; VH Vitreous hemorrhage; PRP panretinal laser photocoagulation; IVK intravitreal kenalog; VMT vitreomacular traction; MH Macular hole;  NVD neovascularization of the disc; NVE neovascularization elsewhere; AREDS age related eye disease study; ARMD age related macular degeneration; POAG primary open angle glaucoma; EBMD epithelial/anterior basement membrane dystrophy; ACIOL anterior chamber intraocular lens; IOL intraocular lens; PCIOL posterior chamber intraocular lens; Phaco/IOL phacoemulsification with intraocular lens placement; PRK photorefractive keratectomy; LASIK laser assisted in situ keratomileusis; HTN hypertension; DM diabetes mellitus; COPD chronic obstructive pulmonary disease

## 2022-03-06 NOTE — Assessment & Plan Note (Signed)
Resolved

## 2022-03-06 NOTE — Assessment & Plan Note (Signed)
No sign of CNVM OS 

## 2022-03-06 NOTE — Assessment & Plan Note (Signed)
Stable white fibrotic disciform scar

## 2022-03-10 ENCOUNTER — Ambulatory Visit (INDEPENDENT_AMBULATORY_CARE_PROVIDER_SITE_OTHER): Payer: Medicare Other

## 2022-03-10 DIAGNOSIS — B351 Tinea unguium: Secondary | ICD-10-CM

## 2022-03-10 NOTE — Progress Notes (Signed)
Patient in office for right hallux nail check.   Right hallux nailbed has a scab over the surface. Patient states the nailbed is still a little sore at this time. Encouraged patient to continue soaking the toe for 1 week. Triple antibiotic ointment applied to area followed by a bandaid.  Advised the patient to notify the office if the soreness does not improve or worsens. Also advised to call the office with any questions, concerns, or change in symptoms.   Patient verbalized understanding.

## 2022-03-20 DIAGNOSIS — K5900 Constipation, unspecified: Secondary | ICD-10-CM | POA: Insufficient documentation

## 2022-06-04 ENCOUNTER — Ambulatory Visit (INDEPENDENT_AMBULATORY_CARE_PROVIDER_SITE_OTHER): Payer: Medicare Other

## 2022-06-04 ENCOUNTER — Ambulatory Visit: Payer: Medicare Other | Admitting: Internal Medicine

## 2022-06-04 DIAGNOSIS — M810 Age-related osteoporosis without current pathological fracture: Secondary | ICD-10-CM | POA: Diagnosis not present

## 2022-06-04 MED ORDER — DENOSUMAB 60 MG/ML ~~LOC~~ SOSY
60.0000 mg | PREFILLED_SYRINGE | Freq: Once | SUBCUTANEOUS | Status: AC
  Administered 2022-06-04: 60 mg via SUBCUTANEOUS

## 2022-06-05 NOTE — Progress Notes (Signed)
Patient was here for prolia injection. Please sign visit.

## 2022-07-21 ENCOUNTER — Encounter: Payer: Self-pay | Admitting: Podiatry

## 2022-07-21 ENCOUNTER — Ambulatory Visit (INDEPENDENT_AMBULATORY_CARE_PROVIDER_SITE_OTHER): Payer: Medicare Other | Admitting: Podiatry

## 2022-07-21 DIAGNOSIS — M79674 Pain in right toe(s): Secondary | ICD-10-CM

## 2022-07-21 DIAGNOSIS — B351 Tinea unguium: Secondary | ICD-10-CM

## 2022-07-21 DIAGNOSIS — M79675 Pain in left toe(s): Secondary | ICD-10-CM

## 2022-07-21 DIAGNOSIS — L603 Nail dystrophy: Secondary | ICD-10-CM

## 2022-07-21 NOTE — Progress Notes (Signed)
  Subjective:  Patient ID: Julia Hinton, female    DOB: 29-Jan-1933,   MRN: [id number]  Chief Complaint  Patient presents with   Nail Problem    Routine foot care     89 y.o. female presents for concern of thickened elongated and painful nails that are difficult to trim. Requesting to have them trimmed today. Relates burning and tingling in their feet. Patient is not diabetic  PCP:  Virgie Dad, MD    . Denies any other pedal complaints. Denies n/v/f/c.   Past Medical History:  Diagnosis Date   Osteoporosis    PVC (premature ventricular contraction)    Vitamin D deficiency     Objective:  Physical Exam: Vascular: DP/PT pulses 2/4 bilateral. CFT <3 seconds. Absent hair growth on digits. Edema noted to bilateral lower extremities. Xerosis noted bilaterally.  Skin. No lacerations or abrasions bilateral feet. Nails 1-5 bilateral  are thickened discolored and elongated with subungual debris.  Musculoskeletal: MMT 5/5 bilateral lower extremities in DF, PF, Inversion and Eversion. Deceased ROM in DF of ankle joint.  Neurological: Sensation intact to light touch. Protective sensation diminished bilateral.    Assessment:   1. Dermatophytosis of nail   2. Onychodystrophy   3. Pain in toes of both feet      Plan:  Patient was evaluated and treated and all questions answered. -Discussed and educated patient on foot care, especially with  regards to the vascular, neurological and musculoskeletal systems.  -Discussed supportive shoes at all times and checking feet regularly.  -Mechanically debrided all nails 1-5 bilateral using sterile nail nipper and filed with dremel without incident  -Answered all patient questions -Patient to return  in 3 months for at risk foot care -Patient advised to call the office if any problems or questions arise in the meantime.   Lorenda Peck, DPM

## 2022-09-11 DIAGNOSIS — H90A32 Mixed conductive and sensorineural hearing loss, unilateral, left ear with restricted hearing on the contralateral side: Secondary | ICD-10-CM | POA: Insufficient documentation

## 2022-09-24 ENCOUNTER — Encounter: Payer: Medicare Other | Admitting: Internal Medicine

## 2022-09-29 ENCOUNTER — Ambulatory Visit (INDEPENDENT_AMBULATORY_CARE_PROVIDER_SITE_OTHER): Payer: Medicare Other | Admitting: Podiatry

## 2022-09-29 ENCOUNTER — Encounter: Payer: Self-pay | Admitting: Podiatry

## 2022-09-29 DIAGNOSIS — B351 Tinea unguium: Secondary | ICD-10-CM

## 2022-09-29 DIAGNOSIS — M79674 Pain in right toe(s): Secondary | ICD-10-CM | POA: Diagnosis not present

## 2022-09-29 DIAGNOSIS — L84 Corns and callosities: Secondary | ICD-10-CM | POA: Diagnosis not present

## 2022-09-29 DIAGNOSIS — M79675 Pain in left toe(s): Secondary | ICD-10-CM | POA: Diagnosis not present

## 2022-09-29 NOTE — Progress Notes (Signed)
  Subjective:  Patient ID: Julia Hinton, female    DOB: Oct 12, 1932,   MRN: [id number]  Chief Complaint  Patient presents with   Nail Problem     Routine foot care   Callouses     Right foot callus in between great toe & second digit     89 y.o. female presents for concern of thickened elongated and painful nails that are difficult to trim. Requesting to have them trimmed today. Relates burning and tingling in their feet. Patient is not diabetic Patient with new concern for callus in between her first and second toe on the right.   PCP:  Mahlon Gammon, MD    . Denies any other pedal complaints. Denies n/v/f/c.   Past Medical History:  Diagnosis Date   Osteoporosis    PVC (premature ventricular contraction)    Vitamin D deficiency     Objective:  Physical Exam: Vascular: DP/PT pulses 2/4 bilateral. CFT <3 seconds. Absent hair growth on digits. Edema noted to bilateral lower extremities. Xerosis noted bilaterally.  Skin. No lacerations or abrasions bilateral feet. Nails 2-5 on right and 3-5 on right  are thickened discolored and elongated with subungual debris. Hyperkeratotic tissue noted medial aspect of right second digit.  Musculoskeletal: MMT 5/5 bilateral lower extremities in DF, PF, Inversion and Eversion. Deceased ROM in DF of ankle joint. Hammertoe and bunion deformities noted bilatearl.  Neurological: Sensation intact to light touch. Protective sensation diminished bilateral.    Assessment:   1. Dermatophytosis of nail   2. Pain in toes of both feet   3. Callus of foot       Plan:  Patient was evaluated and treated and all questions answered. -Discussed and educated patient on foot care, especially with  regards to the vascular, neurological and musculoskeletal systems.  -Discussed supportive shoes at all times and checking feet regularly.  -Mechanically debrided all nails 1-5 bilateral using sterile nail nipper and filed with dremel without incident   -Hyperkeratotic tissue debrided with chisel without incident  -Answered all patient questions -Patient to return  in 3 months for at risk foot care -Patient advised to call the office if any problems or questions arise in the meantime.   Louann Sjogren, DPM

## 2022-10-01 ENCOUNTER — Non-Acute Institutional Stay: Payer: Medicare Other | Admitting: Internal Medicine

## 2022-10-01 ENCOUNTER — Other Ambulatory Visit: Payer: Self-pay | Admitting: Obstetrics and Gynecology

## 2022-10-01 ENCOUNTER — Encounter: Payer: Self-pay | Admitting: Internal Medicine

## 2022-10-01 VITALS — BP 138/62 | HR 66 | Temp 97.6°F | Resp 17 | Ht 59.0 in | Wt 92.2 lb

## 2022-10-01 DIAGNOSIS — H353211 Exudative age-related macular degeneration, right eye, with active choroidal neovascularization: Secondary | ICD-10-CM | POA: Diagnosis not present

## 2022-10-01 DIAGNOSIS — E785 Hyperlipidemia, unspecified: Secondary | ICD-10-CM

## 2022-10-01 DIAGNOSIS — R030 Elevated blood-pressure reading, without diagnosis of hypertension: Secondary | ICD-10-CM

## 2022-10-01 DIAGNOSIS — Z0000 Encounter for general adult medical examination without abnormal findings: Secondary | ICD-10-CM

## 2022-10-01 DIAGNOSIS — M545 Low back pain, unspecified: Secondary | ICD-10-CM

## 2022-10-01 DIAGNOSIS — M810 Age-related osteoporosis without current pathological fracture: Secondary | ICD-10-CM

## 2022-10-01 DIAGNOSIS — G8929 Other chronic pain: Secondary | ICD-10-CM

## 2022-10-01 NOTE — Progress Notes (Signed)
Location:  [facility] Service:  Clinic (12)  Provider:   Code Status:  Goals of Care:     10/01/2022    1:11 PM  Advanced Directives  Does Patient Have a Medical Advance Directive? Yes  Type of Estate agent of [place];Living will  Does patient want to make changes to medical advance directive? No - Patient declined     Chief Complaint  Patient presents with   Medical Management of Chronic Issues    6 month follow up.    HPI: Patient is a 89 y.o. female seen today for medical management of chronic diseases.   Lives with her [place] in [place] in [facility]  Patient has history Osteoporosis and Right Eye Macular degeneration and Vit D Def   Recent Low back Pain due to L1-5 Severe Stenosis Osteoporosis On Prolia Does not want me to repeat her DEXA as she wants to just stay on prolia For now  Low back pain Has seen Dr Ethelene Hal and have it injected 3 times  Non surgical candidate With no relief Taking Tylenol and Lidocaine patches Wants to Restart her therapy again Elevated BP Was told elevated BP in ENT It was 170 SBP It was good today    Past Medical History:  Diagnosis Date   Osteoporosis    PVC (premature ventricular contraction)    Vitamin D deficiency     Past Surgical History:  Procedure Laterality Date   TUBAL LIGATION  1969    Allergies  Allergen Reactions   Sulfa Antibiotics     Outpatient Encounter Medications as of 10/01/2022  Medication Sig   acetaminophen (TYLENOL) 500 MG tablet Take 1,000 mg by mouth 2 (two) times daily.   denosumab (PROLIA) 60 MG/ML SOSY injection Inject 60 mg into the skin every 6 (six) months.   Multiple Vitamins-Calcium (VIACTIV MULTI-VITAMIN) CHEW Chew 2 tablets by mouth daily.   Cholecalciferol 1000 units tablet Take 1,000 Units by mouth daily.   No facility-administered encounter medications on file as of 10/01/2022.    Review of Systems:  Review of Systems  Constitutional:   Negative for activity change and appetite change.  HENT: Negative.    Respiratory:  Negative for cough and shortness of breath.   Cardiovascular:  Negative for leg swelling.  Gastrointestinal:  Negative for constipation.  Genitourinary: Negative.   Musculoskeletal:  Positive for back pain and gait problem. Negative for arthralgias and myalgias.  Skin: Negative.   Neurological:  Negative for dizziness and weakness.  Psychiatric/Behavioral:  Negative for confusion, dysphoric mood and sleep disturbance.     Health Maintenance  Topic Date Due   Medicare Annual Wellness (AWV)  08/19/2018   COVID-19 Vaccine (6 - 2023-24 season) 10/17/2022 (Originally 02/21/2022)   Zoster Vaccines- Shingrix (2 of 2) 05/13/2024 (Originally 10/21/2018)   INFLUENZA VACCINE  01/22/2023   DTaP/Tdap/Td (2 - Td or Tdap) 02/15/2024   Pneumonia Vaccine 53+ Years old  Completed   DEXA SCAN  Completed   HPV VACCINES  Aged Out    Physical Exam: Vitals:   10/01/22 1306  BP: 138/62  Pulse: 66  Resp: 17  Temp: 97.6 F (36.4 C)  TempSrc: Temporal  SpO2: 96%  Weight: 92 lb 3.2 oz (41.8 kg)  Height: 4\' 11"  (1.499 m)   Body mass index is 18.62 kg/m. Physical Exam Vitals reviewed.  Constitutional:      Appearance: Normal appearance.  HENT:     Head: Normocephalic.  Nose: Nose normal.     Mouth/Throat:     Mouth: Mucous membranes are moist.     Pharynx: Oropharynx is clear.  Eyes:     Pupils: Pupils are equal, round, and reactive to light.  Cardiovascular:     Rate and Rhythm: Normal rate and regular rhythm.     Pulses: Normal pulses.     Heart sounds: Normal heart sounds. No murmur heard. Pulmonary:     Effort: Pulmonary effort is normal.     Breath sounds: Normal breath sounds.  Abdominal:     General: Abdomen is flat. Bowel sounds are normal.     Palpations: Abdomen is soft.  Musculoskeletal:        General: No swelling.     Cervical back: Neck supple.  Skin:    General: Skin is warm.   Neurological:     General: No focal deficit present.     Mental Status: She is alert and oriented to person, place, and time.  Psychiatric:        Mood and Affect: Mood normal.        Thought Content: Thought content normal.     Labs reviewed: Basic Metabolic Panel: Recent Labs    01/02/22 0830  NA 140  K 4.2  CL 102  CO2 30  GLUCOSE 64*  BUN 19  CREATININE 0.72  CALCIUM 9.6  TSH 1.37   Liver Function Tests: Recent Labs    01/02/22 0830  AST 22  ALT 15  BILITOT 0.5  PROT 6.8   No results for input(s): "LIPASE", "AMYLASE" in the last 8760 hours. No results for input(s): "AMMONIA" in the last 8760 hours. CBC: Recent Labs    01/02/22 0830  WBC 7.0  NEUTROABS 4,431  HGB 14.4  HCT 43.7  MCV 92.8  PLT 319   Lipid Panel: Recent Labs    01/02/22 0830  CHOL 233*  HDL 74  LDLCALC 135*  TRIG 127  CHOLHDL 3.1   No results found for: "HGBA1C"  Procedures since last visit: No results found.  Assessment/Plan 1. Osteoporosis, unspecified osteoporosis type, unspecified pathological fracture presence Refusing to do Bone density right now Wants to just stay on Prolia Will Check Vit D level 2. Chronic midline low back pain without sciatica Has seen Dr Ethelene Hal Underwent 3 injections with no relief Using Tylenol and Lidocaine Will write for therapy to evaluate and Treat  3. Hyperlipidemia, unspecified hyperlipidemia type Repeat Labs  4. Exudative age-related macular degeneration of right eye with active choroidal neovascularization   5. Elevated blood pressure reading Will Check  BP at home and bring it next appointment    Labs/tests ordered:  * No order type specified * Next appt:  12/12/2022

## 2022-10-06 ENCOUNTER — Encounter (INDEPENDENT_AMBULATORY_CARE_PROVIDER_SITE_OTHER): Payer: Medicare Other | Admitting: Ophthalmology

## 2022-10-09 ENCOUNTER — Other Ambulatory Visit: Payer: Medicare Other

## 2022-11-10 ENCOUNTER — Ambulatory Visit
Admission: RE | Admit: 2022-11-10 | Discharge: 2022-11-10 | Disposition: A | Discharge status: Home / Self Care | Payer: Medicare Other | Source: Ambulatory Visit | Attending: Obstetrics and Gynecology | Admitting: Obstetrics and Gynecology

## 2022-11-10 ENCOUNTER — Ambulatory Visit: Payer: Medicare Other

## 2022-11-10 DIAGNOSIS — Z0000 Encounter for general adult medical examination without abnormal findings: Secondary | ICD-10-CM

## 2022-11-20 ENCOUNTER — Other Ambulatory Visit: Payer: Medicare Other

## 2022-12-01 ENCOUNTER — Ambulatory Visit (INDEPENDENT_AMBULATORY_CARE_PROVIDER_SITE_OTHER): Payer: Medicare Other | Admitting: Podiatry

## 2022-12-01 ENCOUNTER — Encounter: Payer: Self-pay | Admitting: Podiatry

## 2022-12-01 DIAGNOSIS — B351 Tinea unguium: Secondary | ICD-10-CM

## 2022-12-01 DIAGNOSIS — L84 Corns and callosities: Secondary | ICD-10-CM | POA: Diagnosis not present

## 2022-12-01 DIAGNOSIS — M79674 Pain in right toe(s): Secondary | ICD-10-CM

## 2022-12-01 DIAGNOSIS — M79675 Pain in left toe(s): Secondary | ICD-10-CM | POA: Diagnosis not present

## 2022-12-01 NOTE — Progress Notes (Signed)
  Subjective:  Patient ID: Julia Hinton, female    DOB: September 22, 1932,   MRN: [id number]  No chief complaint on file.   89 y.o. female presents for concern of thickened elongated and painful nails that are difficult to trim. Requesting to have them trimmed today. Relates burning and tingling in their feet. Patient is not diabetic Patient with new concern for callus in between her first and second toe on the right.   PCP:  Mahlon Gammon, MD    . Denies any other pedal complaints. Denies n/v/f/c.   Past Medical History:  Diagnosis Date   Osteoporosis    PVC (premature ventricular contraction)    Vitamin D deficiency     Objective:  Physical Exam: Vascular: DP/PT pulses 2/4 bilateral. CFT <3 seconds. Absent hair growth on digits. Edema noted to bilateral lower extremities. Xerosis noted bilaterally.  Skin. No lacerations or abrasions bilateral feet. Nails 2-5 on right and 3-5 on right  are thickened discolored and elongated with subungual debris. Hyperkeratotic tissue noted medial aspect of right second digit.  Musculoskeletal: MMT 5/5 bilateral lower extremities in DF, PF, Inversion and Eversion. Deceased ROM in DF of ankle joint. Hammertoe and bunion deformities noted bilatearl.  Neurological: Sensation intact to light touch. Protective sensation diminished bilateral.    Assessment:   1. Dermatophytosis of nail   2. Pain in toes of both feet   3. Callus of foot        Plan:  Patient was evaluated and treated and all questions answered. -Discussed and educated patient on foot care, especially with  regards to the vascular, neurological and musculoskeletal systems.  -Discussed supportive shoes at all times and checking feet regularly.  -Mechanically debrided all nails 1-5 bilateral using sterile nail nipper and filed with dremel without incident  -Hyperkeratotic tissue debrided with chisel without incident  -Answered all patient questions -Patient to return  in 9 weeks for at  risk foot care -Patient advised to call the office if any problems or questions arise in the meantime.   Louann Sjogren, DPM

## 2022-12-12 ENCOUNTER — Ambulatory Visit (INDEPENDENT_AMBULATORY_CARE_PROVIDER_SITE_OTHER): Payer: Medicare Other

## 2022-12-12 DIAGNOSIS — M810 Age-related osteoporosis without current pathological fracture: Secondary | ICD-10-CM

## 2022-12-12 MED ORDER — DENOSUMAB 60 MG/ML ~~LOC~~ SOSY
60.0000 mg | PREFILLED_SYRINGE | Freq: Once | SUBCUTANEOUS | Status: AC
Start: 2022-12-12 — End: 2022-12-12
  Administered 2022-12-12: 60 mg via SUBCUTANEOUS

## 2022-12-22 ENCOUNTER — Non-Acute Institutional Stay: Payer: Medicare Other | Admitting: Orthopedic Surgery

## 2022-12-22 ENCOUNTER — Encounter: Payer: Self-pay | Admitting: Orthopedic Surgery

## 2022-12-22 VITALS — BP 140/82 | HR 63 | Temp 97.6°F | Resp 16 | Ht 59.0 in | Wt 91.8 lb

## 2022-12-22 DIAGNOSIS — R030 Elevated blood-pressure reading, without diagnosis of hypertension: Secondary | ICD-10-CM | POA: Diagnosis not present

## 2022-12-22 DIAGNOSIS — M79604 Pain in right leg: Secondary | ICD-10-CM

## 2022-12-22 DIAGNOSIS — M7989 Other specified soft tissue disorders: Secondary | ICD-10-CM | POA: Diagnosis not present

## 2022-12-22 NOTE — Progress Notes (Signed)
Location:   [facility]   Place of Service:   [facility] Provider:  Octavia Heir, NP  Mahlon Gammon, MD  Patient Care Team: Mahlon Gammon, MD as PCP - General (Internal Medicine) Ngetich, Donalee Citrin, NP as Nurse Practitioner [facility])  Extended Emergency Contact Information Primary Emergency Contact: Mallinger,[name] Address: [address]          [place], [place] [address] D[place] of [place] Home Phone: [phone] Mobile Phone: [phone] Relation: Spouse Secondary Emergency Contact: Sasso,Avelyn Touch Mobile Phone: [phone] Relation: Daughter  Code Status:  Full code Goals of care: Advanced Directive information    12/22/2022    1:43 PM  Advanced Directives  Does Patient Have a Medical Advance Directive? Yes  Type of Estate agent of [place];Living will;Out of facility DNR (pink MOST or yellow form)  Does patient want to make changes to medical advance directive? No - Patient declined  Copy of Healthcare Power of Attorney in Chart? No - copy requested     No chief complaint on file.   HPI:  Pt is a 89 y.o. female seen today for acute visit due to elevated blood pressure and foot pain.   She was seen by [place] nurse yesterday. She reports elevated blood pressure with SBP in 150's. Denies chest pain, sob, headaches or dizziness. She is not on antihypertensive. BP today 140/82- manual.   H/o varicose veins. Intermittent right leg pain and swelling began 06/28. She denies recent fall, injury, fracture or hospitalization. Pain rated 4/10, described as throbbing. Pain located behind right knee and posterior thigh. She is not on anticoagulation.    Past Medical History:  Diagnosis Date   Osteoporosis    PVC (premature ventricular contraction)    Vitamin D deficiency    Past Surgical History:  Procedure Laterality Date   TUBAL LIGATION  1969    Allergies  Allergen Reactions   Sulfa Antibiotics      Outpatient Encounter Medications as of 12/22/2022  Medication Sig   acetaminophen (TYLENOL) 500 MG tablet Take 1,000 mg by mouth 2 (two) times daily.   CALCIUM PO Take 1 tablet by mouth daily.   Cholecalciferol 1000 units tablet Take 1,000 Units by mouth daily.   denosumab (PROLIA) 60 MG/ML SOSY injection Inject 60 mg into the skin every 6 (six) months.   [DISCONTINUED] Multiple Vitamins-Calcium (VIACTIV MULTI-VITAMIN) CHEW Chew 2 tablets by mouth daily.   No facility-administered encounter medications on file as of 12/22/2022.    Review of Systems  Constitutional:  Negative for activity change and appetite change.  HENT:  Negative for congestion and trouble swallowing.   Eyes:  Negative for visual disturbance.  Respiratory:  Negative for cough, shortness of breath and wheezing.   Cardiovascular:  Positive for leg swelling. Negative for chest pain.  Gastrointestinal:  Negative for abdominal distention and abdominal pain.  Genitourinary:  Negative for dysuria.  Musculoskeletal:  Negative for arthralgias and gait problem.  Skin:  Negative for wound.  Neurological:  Negative for dizziness, weakness and light-headedness.  Psychiatric/Behavioral:  Negative for confusion and dysphoric mood. The patient is not nervous/anxious.     Immunization History  Administered Date(s) Administered   Fluad Quad(high Dose 65+) 04/18/2021   Influenza Split 03/30/2013   Influenza, High Dose Seasonal PF 04/01/2017, 04/06/2019   Influenza-Unspecified 04/14/2014, 04/11/2015, 03/13/2020   Moderna SARS-COV2 Booster Vaccination 12/06/2020   Moderna Sars-Covid-2 Vaccination 06/27/2019, 07/25/2019, 05/07/2020, 04/09/2021   PPD Test  03/14/2014   Pneumococcal Conjugate-13 05/23/2013   Pneumococcal Polysaccharide-23 03/23/2014   Tdap 02/14/2014   Zoster Recombinant(Shingrix) 08/26/2018   Zoster, Unspecified 04/20/2018   Pertinent  Health Maintenance Due  Topic Date Due   INFLUENZA VACCINE  01/22/2023    DEXA SCAN  Completed      07/10/2021    3:29 PM 08/14/2021    2:09 PM 01/08/2022    2:01 PM 10/01/2022    1:11 PM 12/22/2022    1:43 PM  Fall Risk  Falls in the past year? 0 0 0 0 0  Was there an injury with Fall? 0 0 0 0 0  Fall Risk Category Calculator 0 0 0 0 0  Fall Risk Category (Retired) Low Low Low    (RETIRED) Patient Fall Risk Level Low fall risk Low fall risk Low fall risk    Patient at Risk for Falls Due to No Fall Risks No Fall Risks No Fall Risks No Fall Risks No Fall Risks  Fall risk Follow up Falls evaluation completed Falls evaluation completed Falls evaluation completed Falls evaluation completed Falls evaluation completed   Functional Status Survey:    Vitals:   12/22/22 1336  BP: 100/80  Pulse: 63  Resp: 16  Temp: 97.6 F (36.4 C)  SpO2: 97%  Weight: 91 lb 12.8 oz (41.6 kg)  Height: 4\' 11"  (1.499 m)   Body mass index is 18.54 kg/m. Physical Exam Vitals reviewed.  Constitutional:      General: She is not in acute distress. HENT:     Head: Normocephalic.  Eyes:     General:        Right eye: No discharge.        Left eye: No discharge.  Cardiovascular:     Rate and Rhythm: Normal rate and regular rhythm.     Pulses: Normal pulses.     Heart sounds: Normal heart sounds.  Pulmonary:     Effort: Pulmonary effort is normal. No respiratory distress.     Breath sounds: Normal breath sounds. No wheezing or rales.  Abdominal:     General: Bowel sounds are normal.     Palpations: Abdomen is soft.  Musculoskeletal:     Cervical back: Neck supple.     Right lower leg: Edema present.     Left lower leg: No edema.     Comments: Right calf slightly larger than left, right knee and posterior thigh warm to touch. No deformity, injury or skin breakdown to RLE. Varicose veins present.    Skin:    General: Skin is warm.     Capillary Refill: Capillary refill takes less than 2 seconds.  Neurological:     General: No focal deficit present.     Mental Status: She  is alert and oriented to person, place, and time.     Motor: No weakness.     Gait: Gait normal.  Psychiatric:        Mood and Affect: Mood normal.     Labs reviewed: Recent Labs    01/02/22 0830  NA 140  K 4.2  CL 102  CO2 30  GLUCOSE 64*  BUN 19  CREATININE 0.72  CALCIUM 9.6   Recent Labs    01/02/22 0830  AST 22  ALT 15  BILITOT 0.5  PROT 6.8   Recent Labs    01/02/22 0830  WBC 7.0  NEUTROABS 4,431  HGB 14.4  HCT 43.7  MCV 92.8  PLT 319   Lab Results  Component Value Date   TSH 1.37 01/02/2022   No results found for: "HGBA1C" Lab Results  Component Value Date   CHOL 233 (H) 01/02/2022   HDL 74 01/02/2022   LDLCALC 135 (H) 01/02/2022   TRIG 127 01/02/2022   CHOLHDL 3.1 01/02/2022    Significant Diagnostic Results in last 30 days:  No results found.  Assessment/Plan 1. Acute leg pain, right - increased pain at rest began 06/28 - right calf swelling and warmth on exam - not on anticoagulation - right leg doppler to r/o DVT - VAS [place] LOWER EXTREMITY VENOUS (DVT); Future  2. Right leg swelling - see above - if DVT negative> recommend low sodium diet, ted hose and leg elevation - VAS [place] LOWER EXTREMITY VENOUS (DVT); Future  3. Elevated blood pressure reading - 06/30 elevated with [place] nurse> reports SBP in 150's - manual BP 140/82 today - ? Elevation related to new right leg pain - advised to take blood pressure twice daily x 2 weeks - contact PCP if pressures > 150/90    Family/ staff Communication: plan discussed with patient and nurse  Labs/tests ordered:  ultrasound right leg

## 2022-12-22 NOTE — Patient Instructions (Addendum)
Ultrasound of right leg ordered to rule out blood clot  If you experience any chest pan or shortness of breath> report to ED  Please take blood pressure twice daily and write down> if your readings are > 150 consistently- call [facility] for follow up [phone]

## 2022-12-26 ENCOUNTER — Ambulatory Visit (HOSPITAL_COMMUNITY)
Admission: RE | Admit: 2022-12-26 | Discharge: 2022-12-26 | Disposition: A | Discharge status: Home / Self Care | Payer: Medicare Other | Source: Ambulatory Visit | Attending: Cardiovascular Disease | Admitting: Cardiovascular Disease

## 2022-12-26 DIAGNOSIS — M79604 Pain in right leg: Secondary | ICD-10-CM | POA: Diagnosis present

## 2022-12-26 DIAGNOSIS — M7989 Other specified soft tissue disorders: Secondary | ICD-10-CM | POA: Insufficient documentation

## 2022-12-29 ENCOUNTER — Encounter: Payer: Self-pay | Admitting: Orthopedic Surgery

## 2022-12-29 ENCOUNTER — Non-Acute Institutional Stay: Payer: Medicare Other | Admitting: Orthopedic Surgery

## 2022-12-29 VITALS — BP 138/72 | HR 62 | Temp 96.9°F | Resp 16 | Ht 59.0 in | Wt 91.7 lb

## 2022-12-29 DIAGNOSIS — R030 Elevated blood-pressure reading, without diagnosis of hypertension: Secondary | ICD-10-CM

## 2022-12-29 DIAGNOSIS — R600 Localized edema: Secondary | ICD-10-CM

## 2022-12-29 MED ORDER — FUROSEMIDE 20 MG PO TABS
20.0000 mg | ORAL_TABLET | Freq: Every day | ORAL | 0 refills | Status: DC
Start: 2022-12-29 — End: 2023-01-19

## 2022-12-29 NOTE — Progress Notes (Signed)
[facility]  Clinic (12) Provider:  Octavia Heir, NP   Mahlon Gammon, MD  Patient Care Team: Mahlon Gammon, MD as PCP - General (Internal Medicine) Ngetich, Donalee Citrin, NP as Nurse Practitioner [facility] Medicine)  Extended Emergency Contact Information Primary Emergency Contact: [name],[name] Address: [address]          [place], [place] [address] [place] of [place] Home Phone: [phone] Mobile Phone: [phone] Relation: Spouse Secondary Emergency Contact: Schow,Jadarion Halbig Mobile Phone: [phone] Relation: Daughter  Code Status:  Full Code Goals of care: Advanced Directive information    12/29/2022    1:29 PM  Advanced Directives  Does Patient Have a Medical Advance Directive? Yes  Type of Estate agent of [place];Living will;Out of facility DNR (pink MOST or yellow form)  Does patient want to make changes to medical advance directive? No - Patient declined  Copy of Healthcare Power of Attorney in Chart? No - copy requested     Chief Complaint  Patient presents with   Acute Visit    Patient complains of swelling in both feet, and swelling in right leg.    HPI:  Pt is a 89 y.o. female seen today for acute visit due to increased leg swelling.   07/01 she c/o right leg pain and swelling. Venous doppler negative for DVT. Today, both legs with 1+ pitting edema. She is able to wear shoes, but reports they are tight. She denies chest pain, sob or weight gain. Admits to eating some salty foods. She does not always elevated legs due to chronic back pain. No recent injuries. Afebrile. Vitals stable.   06/30 elevated bp reading with SBP in 150's. She has bought blood pressure cuff. She did not bring readings with her today. Initial blood pressure was slightly elevated. She is petite stature. Rechecked blood pressure with child bp cuff and bp was at goal < 150/90.     Past Medical History:   Diagnosis Date   Osteoporosis    PVC (premature ventricular contraction)    Vitamin D deficiency    Past Surgical History:  Procedure Laterality Date   TUBAL LIGATION  1969    Allergies  Allergen Reactions   Sulfa Antibiotics     Outpatient Encounter Medications as of 12/29/2022  Medication Sig   acetaminophen (TYLENOL) 500 MG tablet Take 1,000 mg by mouth 2 (two) times daily.   CALCIUM PO Take 1 tablet by mouth daily.   Cholecalciferol 1000 units tablet Take 1,000 Units by mouth daily.   denosumab (PROLIA) 60 MG/ML SOSY injection Inject 60 mg into the skin every 6 (six) months.   furosemide (LASIX) 20 MG tablet Take 1 tablet (20 mg total) by mouth daily for 2 days.   No facility-administered encounter medications on file as of 12/29/2022.    Review of Systems  Constitutional:  Negative for activity change and appetite change.  HENT:  Negative for trouble swallowing.   Respiratory:  Negative for cough, shortness of breath and wheezing.   Cardiovascular:  Positive for leg swelling. Negative for chest pain.  Gastrointestinal:  Negative for abdominal distention and abdominal pain.  Genitourinary:  Negative for dysuria.  Musculoskeletal:  Positive for back pain and gait problem.  Skin:  Negative for wound.  Neurological:  Negative for dizziness and headaches.  Psychiatric/Behavioral:  Negative for confusion and dysphoric mood. The patient is not nervous/anxious.     Immunization History  Administered  Date(s) Administered   Fluad Quad(high Dose 65+) 04/18/2021   Influenza Split 03/30/2013   Influenza, High Dose Seasonal PF 04/01/2017, 04/06/2019   Influenza-Unspecified 04/14/2014, 04/11/2015, 03/13/2020   Moderna SARS-COV2 Booster Vaccination 12/06/2020   Moderna Sars-Covid-2 Vaccination 06/27/2019, 07/25/2019, 05/07/2020, 04/09/2021   PPD Test 03/14/2014   Pneumococcal Conjugate-13 05/23/2013   Pneumococcal Polysaccharide-23 03/23/2014   Tdap 02/14/2014   Zoster  Recombinant(Shingrix) 08/26/2018   Zoster, Unspecified 04/20/2018   Pertinent  Health Maintenance Due  Topic Date Due   INFLUENZA VACCINE  01/22/2023   DEXA SCAN  Completed      08/14/2021    2:09 PM 01/08/2022    2:01 PM 10/01/2022    1:11 PM 12/22/2022    1:43 PM 12/29/2022    1:29 PM  Fall Risk  Falls in the past year? 0 0 0 0 0  Was there an injury with Fall? 0 0 0 0 0  Fall Risk Category Calculator 0 0 0 0 0  Fall Risk Category (Retired) Low Low     (RETIRED) Patient Fall Risk Level Low fall risk Low fall risk     Patient at Risk for Falls Due to No Fall Risks No Fall Risks No Fall Risks No Fall Risks No Fall Risks  Fall risk Follow up Falls evaluation completed Falls evaluation completed Falls evaluation completed Falls evaluation completed Falls evaluation completed   Functional Status Survey:    Vitals:   12/29/22 1328 12/29/22 1356  BP: (!) 150/88 138/72  Pulse: 62   Resp: 16   Temp: (!) 96.9 F (36.1 C)   SpO2: 94%   Weight: 91 lb 11.2 oz (41.6 kg)   Height: 4\' 11"  (1.499 m)    Body mass index is 18.52 kg/m. Physical Exam Vitals reviewed.  Constitutional:      General: She is not in acute distress. Eyes:     General:        Right eye: No discharge.        Left eye: No discharge.  Cardiovascular:     Rate and Rhythm: Normal rate and regular rhythm.     Pulses: Normal pulses.     Heart sounds: Normal heart sounds.  Pulmonary:     Effort: No respiratory distress.     Breath sounds: Normal breath sounds. No wheezing or rales.  Abdominal:     General: Bowel sounds are normal.     Palpations: Abdomen is soft.  Musculoskeletal:     Cervical back: Neck supple.     Right lower leg: Edema present.     Left lower leg: Edema present.     Comments: 1+ pitting  Skin:    General: Skin is warm.     Capillary Refill: Capillary refill takes less than 2 seconds.  Neurological:     General: No focal deficit present.     Mental Status: She is alert and oriented to  person, place, and time.     Motor: Weakness present.     Gait: Gait abnormal.  Psychiatric:        Mood and Affect: Mood normal.     Labs reviewed: Recent Labs    01/02/22 0830  NA 140  K 4.2  CL 102  CO2 30  GLUCOSE 64*  BUN 19  CREATININE 0.72  CALCIUM 9.6   Recent Labs    01/02/22 0830  AST 22  ALT 15  BILITOT 0.5  PROT 6.8   Recent Labs    01/02/22 0830  WBC 7.0  NEUTROABS 4,431  HGB 14.4  HCT 43.7  MCV 92.8  PLT 319   Lab Results  Component Value Date   TSH 1.37 01/02/2022   No results found for: "HGBA1C" Lab Results  Component Value Date   CHOL 233 (H) 01/02/2022   HDL 74 01/02/2022   LDLCALC 135 (H) 01/02/2022   TRIG 127 01/02/2022   CHOLHDL 3.1 01/02/2022    Significant Diagnostic Results in last 30 days:  VAS [place] LOWER EXTREMITY VENOUS (DVT)  Result Date: 12/26/2022  Lower Venous DVT Study Patient Name:  Julia Hinton  Date of Exam:   12/26/2022 Medical Rec #: [id number]       Accession #:    [id number] Date of Birth: December 23, 1932       Patient Gender: F Patient Age:   52 years Exam Location:  [facility] Procedure:      VAS [place] LOWER EXTREMITY VENOUS (DVT) Referring Phys: Meshilem Machuca --------------------------------------------------------------------------------  Indications: Acute onset of right lower extremity pain and swelling x 2 weeks. Patient denies any SOB.  Risk Factors: None identified. Comparison Study: NA Performing Technologist: Tyna Jaksch RVT  Examination Guidelines: A complete evaluation includes B-mode imaging, spectral Doppler, color Doppler, and power Doppler as needed of all accessible portions of each vessel. Bilateral testing is considered an integral part of a complete examination. Limited examinations for reoccurring indications may be performed as noted. The reflux portion of the exam is performed with the patient in reverse Trendelenburg.  +---------+---------------+---------+-----------+----------+--------------+ RIGHT     CompressibilityPhasicitySpontaneityPropertiesThrombus Aging +---------+---------------+---------+-----------+----------+--------------+ CFV      Full           Yes      Yes                                 +---------+---------------+---------+-----------+----------+--------------+ SFJ      Full           Yes      Yes                                 +---------+---------------+---------+-----------+----------+--------------+ FV Prox  Full           Yes      Yes                                 +---------+---------------+---------+-----------+----------+--------------+ FV Mid   Full                                                        +---------+---------------+---------+-----------+----------+--------------+ FV DistalFull           Yes      Yes                                 +---------+---------------+---------+-----------+----------+--------------+ PFV      Full                    Yes                                 +---------+---------------+---------+-----------+----------+--------------+  POP      Full           Yes      Yes                                 +---------+---------------+---------+-----------+----------+--------------+ PTV      Full                                                        +---------+---------------+---------+-----------+----------+--------------+ PERO     Full                                                        +---------+---------------+---------+-----------+----------+--------------+ Gastroc  Full                                                        +---------+---------------+---------+-----------+----------+--------------+ GSV      Full           Yes      Yes                                 +---------+---------------+---------+-----------+----------+--------------+   +----+---------------+---------+-----------+----------+--------------+  LEFTCompressibilityPhasicitySpontaneityPropertiesThrombus Aging +----+---------------+---------+-----------+----------+--------------+ CFV Full           Yes      Yes                                 +----+---------------+---------+-----------+----------+--------------+    Summary: RIGHT: - No evidence of deep vein thrombosis in the lower extremity. No indirect evidence of obstruction proximal to the inguinal ligament. - No cystic structure found in the popliteal fossa.  LEFT: - No evidence of common femoral vein obstruction.  *See table(s) above for measurements and observations. Electronically signed by Lemar Livings MD on 12/26/2022 at 2:34:59 PM.    Final     Assessment/Plan 1. Lower leg edema - ongoing - 06/30 right leg swelling with pain> doppler negative for dvt - now both extremities with 1+ pitting edema - will try furosemide 20 mg po qAM x 2 days, then compression stockings - if no improvement> consider BNP - discussed low sodium diet - discussed leg elevation - furosemide (LASIX) 20 MG tablet; Take 1 tablet (20 mg total) by mouth daily for 2 days.  Dispense: 2 tablet; Refill: 0  2. Elevated blood pressure reading - rechecked bp controlled - needs pressures checked with child cuff   Total time: 32 minutes. Greater than 50% of total time spent doing patient education regarding lower leg edema including lab results and symptom/medication management.     Family/ staff Communication: plan discussed with patient and nurse  Labs/tests ordered:  none

## 2022-12-29 NOTE — Patient Instructions (Signed)
Recommend asking daughter to purchase 2 pairs of compression stocking on [employer]> 15-20 mm of pressure. Wear stocking every morning and remove at bedtime.   Limit salt in diet> will make swelling in legs worse  Elevated legs in recliner to help with swelling  Will prescribe furosemide ( diuretic) take one pill in the morning> will make you urinate more> if legs improved after one dose> you do no have to take second dose  Smaller blood pressure cuff will give you more accurate reading> may ask [place] nurse to monitor as well

## 2023-01-19 ENCOUNTER — Encounter: Payer: Self-pay | Admitting: Orthopedic Surgery

## 2023-01-19 ENCOUNTER — Non-Acute Institutional Stay (INDEPENDENT_AMBULATORY_CARE_PROVIDER_SITE_OTHER): Payer: Medicare Other | Admitting: Orthopedic Surgery

## 2023-01-19 VITALS — BP 138/68 | HR 66 | Temp 96.6°F | Resp 16 | Ht 59.0 in | Wt 90.6 lb

## 2023-01-19 DIAGNOSIS — M25561 Pain in right knee: Secondary | ICD-10-CM

## 2023-01-19 MED ORDER — DICLOFENAC SODIUM 1 % EX GEL
2.0000 g | Freq: Four times a day (QID) | CUTANEOUS | 5 refills | Status: DC
Start: 2023-01-19 — End: 2024-03-20

## 2023-01-19 MED ORDER — PREDNISONE 20 MG PO TABS
ORAL_TABLET | ORAL | 0 refills | Status: AC
Start: 2023-01-19 — End: 2023-01-26

## 2023-01-19 NOTE — Patient Instructions (Signed)
Hold physical therapy for 1 month  Please take prednisone every morning until complete  Try using voltaren gel> apply on right knee or hip 3-4 times daily

## 2023-01-19 NOTE — Progress Notes (Signed)
Careteam: Patient Care Team: Mahlon Gammon, MD as PCP - General (Internal Medicine) Ngetich, Donalee Citrin, NP as Nurse Practitioner (Family Medicine)  Seen by: Hazle Nordmann, AGNP-C  PLACE OF SERVICE:  [facility]  Advanced Directive information Does Patient Have a Medical Advance Directive?: Yes, Type of Advance Directive: Healthcare Power of [place];Living will;Out of facility DNR (pink MOST or yellow form), Does patient want to make changes to medical advance directive?: No - Patient declined  Allergies  Allergen Reactions   Sulfa Antibiotics     Chief Complaint  Patient presents with   Medical Management of Chronic Issues    Patient has medication concerns. Patient states that Lasix didn't work.     HPI: Patient is a 89 y.o. female seen today for acute visit due right knee pain.   07/01 she had right leg pain and swelling. 07/05 venous doppler negative for DVT. She continued to have mild swelling to both lower extremities. She was prescribed furosemide 20 mg x 2 days and edema improved. She has also started wearing compression stockings and swelling has improved. She reports right knee pain began after starting PT. She is averaging about 1-2 PT sessions weekly. Right knee pain is always worse after exercising. She has tried tylenol and aspirin without success. Afebrile. Vitals stable.    Review of Systems:  Review of Systems  Constitutional:  Negative for fever and malaise/fatigue.  Respiratory:  Negative for cough, shortness of breath and wheezing.   Cardiovascular:  Negative for chest pain and leg swelling.  Musculoskeletal:  Positive for joint pain. Negative for falls and myalgias.  Neurological:  Negative for sensory change.  Psychiatric/Behavioral:  Negative for depression. The patient is not nervous/anxious.     Past Medical History:  Diagnosis Date   Osteoporosis    PVC (premature ventricular contraction)    Vitamin D deficiency    Past Surgical History:   Procedure Laterality Date   TUBAL LIGATION  1969   Social History:   reports that she has never smoked. She has never used smokeless tobacco. She reports current alcohol use of about 2.0 standard drinks of alcohol per week. She reports that she does not use drugs.  Family History  Problem Relation Age of Onset   Dementia Mother    Osteoporosis Mother    Cancer Sister    Breast cancer Sister     Medications: Patient's Medications  New Prescriptions   No medications on file  Previous Medications   ACETAMINOPHEN (TYLENOL) 500 MG TABLET    Take 1,000 mg by mouth 2 (two) times daily.   CALCIUM PO    Take 1 tablet by mouth daily.   CHOLECALCIFEROL 1000 UNITS TABLET    Take 1,000 Units by mouth daily.   DENOSUMAB (PROLIA) 60 MG/ML SOSY INJECTION    Inject 60 mg into the skin every 6 (six) months.  Modified Medications   No medications on file  Discontinued Medications   FUROSEMIDE (LASIX) 20 MG TABLET    Take 1 tablet (20 mg total) by mouth daily for 2 days.    Physical Exam:  Vitals:   01/19/23 1405 01/19/23 1412  BP: (!) 140/90 138/68  Pulse: 66   Resp: 16   Temp: (!) 96.6 F (35.9 C)   SpO2: 99%   Weight: 90 lb 9.6 oz (41.1 kg)   Height: 4\' 11"  (1.499 m)    Body mass index is 18.3 kg/m. Wt Readings from Last 3 Encounters:  01/19/23 90 lb  9.6 oz (41.1 kg)  12/29/22 91 lb 11.2 oz (41.6 kg)  12/22/22 91 lb 12.8 oz (41.6 kg)    Physical Exam Vitals reviewed.  Constitutional:      General: She is not in acute distress. HENT:     Head: Normocephalic.  Eyes:     General:        Right eye: No discharge.        Left eye: No discharge.  Cardiovascular:     Rate and Rhythm: Normal rate and regular rhythm.     Pulses: Normal pulses.     Heart sounds: Normal heart sounds.  Pulmonary:     Effort: Pulmonary effort is normal. No respiratory distress.     Breath sounds: Normal breath sounds. No wheezing.  Abdominal:     General: Bowel sounds are normal. There is no  distension.     Palpations: Abdomen is soft.     Tenderness: There is no abdominal tenderness.  Musculoskeletal:     Cervical back: Neck supple.     Right knee: No swelling, deformity or crepitus. Decreased range of motion. Tenderness present over the MCL.     Right lower leg: No edema.     Left lower leg: No edema.  Skin:    General: Skin is warm.     Capillary Refill: Capillary refill takes less than 2 seconds.  Neurological:     General: No focal deficit present.     Mental Status: She is alert and oriented to person, place, and time.     Motor: No weakness.     Gait: Gait normal.  Psychiatric:        Mood and Affect: Mood normal.     Labs reviewed: Basic Metabolic Panel: No results for input(s): "NA", "K", "CL", "CO2", "GLUCOSE", "BUN", "CREATININE", "CALCIUM", "MG", "PHOS", "TSH" in the last 8760 hours. Liver Function Tests: No results for input(s): "AST", "ALT", "ALKPHOS", "BILITOT", "PROT", "ALBUMIN" in the last 8760 hours. No results for input(s): "LIPASE", "AMYLASE" in the last 8760 hours. No results for input(s): "AMMONIA" in the last 8760 hours. CBC: No results for input(s): "WBC", "NEUTROABS", "HGB", "HCT", "MCV", "PLT" in the last 8760 hours. Lipid Panel: No results for input(s): "CHOL", "HDL", "LDLCALC", "TRIG", "CHOLHDL", "LDLDIRECT" in the last 8760 hours. TSH: No results for input(s): "TSH" in the last 8760 hours. A1C: No results found for: "HGBA1C"   Assessment/Plan 1. Acute pain of right knee - ongoing - ? started with PT, also had increased edema around same time - 07/05 venous doppler negative - tenderness along right MCL - advised to hold off on PT x 1 month - will start prednisone taper and voltaren gel for pain - predniSONE (DELTASONE) 20 MG tablet; Take 2 tablets (40 mg total) by mouth daily with breakfast for 2 days, THEN 1 tablet (20 mg total) daily with breakfast for 5 days.  Dispense: 9 tablet; Refill: 0 - diclofenac Sodium (VOLTAREN  ARTHRITIS PAIN) 1 % GEL; Apply 2 g topically 4 (four) times daily. Apply to right knee or hip as needed  Dispense: 2 g; Refill: 5  Next appt: Visit date not found  Teriyah Purington Scherry Ran  [facility] [phone]

## 2023-02-04 ENCOUNTER — Ambulatory Visit (INDEPENDENT_AMBULATORY_CARE_PROVIDER_SITE_OTHER): Payer: Medicare Other | Admitting: Podiatry

## 2023-02-04 ENCOUNTER — Encounter: Payer: Self-pay | Admitting: Podiatry

## 2023-02-04 DIAGNOSIS — M79674 Pain in right toe(s): Secondary | ICD-10-CM | POA: Diagnosis not present

## 2023-02-04 DIAGNOSIS — M79675 Pain in left toe(s): Secondary | ICD-10-CM | POA: Diagnosis not present

## 2023-02-04 DIAGNOSIS — L84 Corns and callosities: Secondary | ICD-10-CM

## 2023-02-04 DIAGNOSIS — B351 Tinea unguium: Secondary | ICD-10-CM | POA: Diagnosis not present

## 2023-02-04 NOTE — Progress Notes (Signed)
  Subjective:  Patient ID: Julia Hinton, female    DOB: September 22, 1932,   MRN: [id number]  No chief complaint on file.   89 y.o. female presents for concern of thickened elongated and painful nails that are difficult to trim. Requesting to have them trimmed today. Relates burning and tingling in their feet. Patient is not diabetic Patient with new concern for callus in between her first and second toe on the right.   PCP:  Mahlon Gammon, MD    . Denies any other pedal complaints. Denies n/v/f/c.   Past Medical History:  Diagnosis Date   Osteoporosis    PVC (premature ventricular contraction)    Vitamin D deficiency     Objective:  Physical Exam: Vascular: DP/PT pulses 2/4 bilateral. CFT <3 seconds. Absent hair growth on digits. Edema noted to bilateral lower extremities. Xerosis noted bilaterally.  Skin. No lacerations or abrasions bilateral feet. Nails 2-5 on right and 3-5 on right  are thickened discolored and elongated with subungual debris. Hyperkeratotic tissue noted medial aspect of right second digit.  Musculoskeletal: MMT 5/5 bilateral lower extremities in DF, PF, Inversion and Eversion. Deceased ROM in DF of ankle joint. Hammertoe and bunion deformities noted bilatearl.  Neurological: Sensation intact to light touch. Protective sensation diminished bilateral.    Assessment:   1. Dermatophytosis of nail   2. Pain in toes of both feet   3. Callus of foot        Plan:  Patient was evaluated and treated and all questions answered. -Discussed and educated patient on foot care, especially with  regards to the vascular, neurological and musculoskeletal systems.  -Discussed supportive shoes at all times and checking feet regularly.  -Mechanically debrided all nails 1-5 bilateral using sterile nail nipper and filed with dremel without incident  -Hyperkeratotic tissue debrided with chisel without incident  -Answered all patient questions -Patient to return  in 9 weeks for at  risk foot care -Patient advised to call the office if any problems or questions arise in the meantime.   Louann Sjogren, DPM

## 2023-02-16 ENCOUNTER — Non-Acute Institutional Stay: Payer: Medicare Other | Admitting: Orthopedic Surgery

## 2023-02-16 VITALS — BP 145/60 | HR 68 | Temp 97.0°F | Resp 18 | Ht 59.0 in | Wt 91.5 lb

## 2023-02-16 DIAGNOSIS — M25561 Pain in right knee: Secondary | ICD-10-CM | POA: Diagnosis not present

## 2023-02-16 DIAGNOSIS — R600 Localized edema: Secondary | ICD-10-CM

## 2023-02-16 NOTE — Patient Instructions (Addendum)
It is ok to restart physical therapy  Try "[place]" pain patches> purchase at [employer]> apply to back and knee OR where it hurts>it is ok to cut them in half  Continue tylenol daily> if you do not think it is helping> stop taking for 1-2 days and see how you are  You can take ibuprofen 200-600 mg " as needed," ALWAYS TAKE WITH FOOD

## 2023-02-16 NOTE — Progress Notes (Unsigned)
[facility]  [facility]  [facility] (12) Provider:  Octavia Heir, NP   Mahlon Gammon, MD  Patient Care Team: Mahlon Gammon, MD as PCP - General (Internal Medicine) Ngetich, Donalee Citrin, NP as Nurse Practitioner (Family Medicine)  Extended Emergency Contact Information Primary Emergency Contact: [facility] Phone: [phone] Relation: Daughter Secondary Emergency Contact: Drummond,[name] Address: [address]          [place], [place] [address] [place] of [place] Home Phone: [phone] Mobile Phone: [phone] Relation: Spouse  Code Status:  Full code Goals of care: Advanced Directive information    01/19/2023    2:11 PM  Advanced Directives  Does Patient Have a Medical Advance Directive? Yes  Type of Estate agent of [place];Living will;Out of facility DNR (pink MOST or yellow form)  Does patient want to make changes to medical advance directive? No - Patient declined  Copy of Healthcare Power of Attorney in Chart? No - copy requested     Chief Complaint  Patient presents with   Acute Visit    Ongoing leg pain    HPI:  Pt is a 89 y.o. female seen today for acute visit due to ongoing leg pain.   "07/01 she had increased pain to right knee, behind knee and posterior thigh.  07/05 venous doppler negative for DVT. She continued to have mild swelling to both lower extremities. She was prescribed furosemide 20 mg x 2 days and edema improved. Also started wearing compression stockings and swelling has improved. She reports right knee pain began after starting PT."  07/29 right leg/knee pain continued. She was advised to stop PT x 1 month and try voltaren gel. She was also given prednisone taper. Today, she reports improved right leg/knee pain and swelling. She did not think prednisone helped leg/knee pain. She is wearing compression stockings daily. No recent falls or injuries. Does not think tylenol 1000  mg BID is helping. She admits to taking ibuprofen 200 mg prn, but not taking on daily basis. Afebrile. Vitals stable.     Past Medical History:  Diagnosis Date   Osteoporosis    PVC (premature ventricular contraction)    Vitamin D deficiency    Past Surgical History:  Procedure Laterality Date   TUBAL LIGATION  1969    Allergies  Allergen Reactions   Sulfa Antibiotics     Outpatient Encounter Medications as of 02/16/2023  Medication Sig   acetaminophen (TYLENOL) 500 MG tablet Take 1,000 mg by mouth 2 (two) times daily.   CALCIUM PO Take 1 tablet by mouth daily.   Cholecalciferol 1000 units tablet Take 1,000 Units by mouth daily.   denosumab (PROLIA) 60 MG/ML SOSY injection Inject 60 mg into the skin every 6 (six) months.   diclofenac Sodium (VOLTAREN ARTHRITIS PAIN) 1 % GEL Apply 2 g topically 4 (four) times daily. Apply to right knee or hip as needed   No facility-administered encounter medications on file as of 02/16/2023.    Review of Systems  Constitutional:  Negative for activity change and appetite change.  Respiratory:  Negative for cough, shortness of breath and wheezing.   Cardiovascular:  Positive for leg swelling. Negative for chest pain.  Musculoskeletal:  Positive for arthralgias. Negative for gait problem and myalgias.  Skin:  Negative for wound.  Psychiatric/Behavioral:  Positive for confusion. Negative for dysphoric mood. The patient is not nervous/anxious.     Immunization History  Administered Date(s) Administered  Fluad Quad(high Dose 65+) 04/18/2021   Influenza Split 03/30/2013   Influenza, High Dose Seasonal PF 04/01/2017, 04/06/2019   Influenza-Unspecified 04/14/2014, 04/11/2015, 03/13/2020   Moderna SARS-COV2 Booster Vaccination 12/06/2020   Moderna Sars-Covid-2 Vaccination 06/27/2019, 07/25/2019, 05/07/2020, 04/09/2021   PPD Test 03/14/2014   Pneumococcal Conjugate-13 05/23/2013   Pneumococcal Polysaccharide-23 03/23/2014   Tdap 02/14/2014    Zoster Recombinant(Shingrix) 08/26/2018   Zoster, Unspecified 04/20/2018   Pertinent  Health Maintenance Due  Topic Date Due   INFLUENZA VACCINE  01/22/2023   DEXA SCAN  Completed      01/08/2022    2:01 PM 10/01/2022    1:11 PM 12/22/2022    1:43 PM 12/29/2022    1:29 PM 01/19/2023    2:12 PM  Fall Risk  Falls in the past year? 0 0 0 0 0  Was there an injury with Fall? 0 0 0 0 0  Fall Risk Category Calculator 0 0 0 0 0  Fall Risk Category (Retired) Low      (RETIRED) Patient Fall Risk Level Low fall risk      Patient at Risk for Falls Due to No Fall Risks No Fall Risks No Fall Risks No Fall Risks No Fall Risks  Fall risk Follow up Falls evaluation completed Falls evaluation completed Falls evaluation completed Falls evaluation completed Falls evaluation completed;Education provided;Falls prevention discussed   Functional Status Survey:    Vitals:   02/16/23 1316  BP: (!) 145/60  Pulse: 68  Resp: 18  Temp: (!) 97 F (36.1 C)  SpO2: 95%  Weight: 91 lb 8 oz (41.5 kg)  Height: 4\' 11"  (1.499 m)   Body mass index is 18.48 kg/m. Physical Exam Vitals reviewed.  Constitutional:      General: She is not in acute distress. HENT:     Head: Normocephalic.  Eyes:     General:        Right eye: No discharge.        Left eye: No discharge.  Cardiovascular:     Rate and Rhythm: Normal rate and regular rhythm.     Pulses: Normal pulses.     Heart sounds: Normal heart sounds.  Pulmonary:     Effort: Pulmonary effort is normal. No respiratory distress.     Breath sounds: Normal breath sounds. No wheezing.  Musculoskeletal:     Cervical back: Neck supple.     Right knee: No swelling or crepitus. Normal range of motion. No tenderness.     Right lower leg: No edema.     Left lower leg: No edema.     Comments: Compression stockings on  Skin:    General: Skin is warm.     Capillary Refill: Capillary refill takes less than 2 seconds.  Neurological:     General: No focal deficit  present.     Mental Status: She is alert and oriented to person, place, and time.     Motor: No weakness.     Gait: Gait normal.  Psychiatric:        Mood and Affect: Mood normal.     Labs reviewed: No results for input(s): "NA", "K", "CL", "CO2", "GLUCOSE", "BUN", "CREATININE", "CALCIUM", "MG", "PHOS" in the last 8760 hours. No results for input(s): "AST", "ALT", "ALKPHOS", "BILITOT", "PROT", "ALBUMIN" in the last 8760 hours. No results for input(s): "WBC", "NEUTROABS", "HGB", "HCT", "MCV", "PLT" in the last 8760 hours. Lab Results  Component Value Date   TSH 1.37 01/02/2022   No results found for: "  HGBA1C" Lab Results  Component Value Date   CHOL 233 (H) 01/02/2022   HDL 74 01/02/2022   LDLCALC 135 (H) 01/02/2022   TRIG 127 01/02/2022   CHOLHDL 3.1 01/02/2022    Significant Diagnostic Results in last 30 days:  No results found.  Assessment/Plan 1. Acute pain of right knee - began 06/28 after starting PT, pain behind knee/ posterior thigh - 07/05 doppler negative for DVT/ bakers cyst - 07/29 PT held x 1 month> pain improved - she did not think prednisone taper reduced pain - suspect arthritis - exam unremarkable, FROM> no pain with right knee flexion 90 at degrees - discussed "drug holiday" from tylenol to understand effectiveness - may use ibuprofen 200-600 mg po prn> do not recommend daily use> take with food - ok to restart PT - consider xray right knee if pain worsens  2. Lower leg edema - see above - cont daily use of compression stockings - limit sodium in diet  Family/ staff Communication: plan discussed with patient and nurse  Labs/tests ordered:  none

## 2023-02-17 ENCOUNTER — Encounter: Payer: Self-pay | Admitting: Orthopedic Surgery

## 2023-03-17 DIAGNOSIS — H6122 Impacted cerumen, left ear: Secondary | ICD-10-CM | POA: Insufficient documentation

## 2023-04-08 ENCOUNTER — Encounter: Payer: Self-pay | Admitting: Internal Medicine

## 2023-04-08 ENCOUNTER — Non-Acute Institutional Stay: Payer: Medicare Other | Admitting: Internal Medicine

## 2023-04-08 VITALS — BP 130/63 | HR 67 | Temp 97.8°F | Resp 17 | Ht 59.0 in | Wt 90.9 lb

## 2023-04-08 DIAGNOSIS — E785 Hyperlipidemia, unspecified: Secondary | ICD-10-CM

## 2023-04-08 DIAGNOSIS — H353211 Exudative age-related macular degeneration, right eye, with active choroidal neovascularization: Secondary | ICD-10-CM

## 2023-04-08 DIAGNOSIS — M545 Low back pain, unspecified: Secondary | ICD-10-CM | POA: Diagnosis not present

## 2023-04-08 DIAGNOSIS — M810 Age-related osteoporosis without current pathological fracture: Secondary | ICD-10-CM | POA: Diagnosis not present

## 2023-04-08 DIAGNOSIS — M25561 Pain in right knee: Secondary | ICD-10-CM

## 2023-04-08 DIAGNOSIS — G8929 Other chronic pain: Secondary | ICD-10-CM

## 2023-04-08 NOTE — Progress Notes (Signed)
[facility] ([facility])  Provider:   Code Status:  Goals of Care:     04/08/2023   10:43 AM  Advanced Directives  Does Patient Have a Medical Advance Directive? Yes  Type of Estate agent of [place];Living will;Out of facility DNR (pink MOST or yellow form)  Does patient want to make changes to medical advance directive? No - Patient declined  Copy of Healthcare Power of Attorney in Chart? No - copy requested     Chief Complaint  Patient presents with  . Medical Management of Chronic Issues    Patient is being seen for 6 month follow up    HPI: Patient is a 89 y.o. female seen today for medical management of chronic diseases.    Lives [place] in [facility]  Patient has history Osteoporosis and Right Eye Macular degeneration and Vit D Def    Osteoporosis On Prolia She went to see dentis few weeks ago and he told her that she has Tooth infection and it could be due to Prolia Now she does not want to take Prolia now   Low back pain due to L1-5 Stenosis Has seen Dr Ethelene Hal and have it injected 3 times  Non surgical candidate With no relief  Works with therapy now and doing better  Elevated BP Sometimes when she is very Anxious  Right knee pain Got Dose of Prednisone taper Doing well now    Past Medical History:  Diagnosis Date  . Osteoporosis   . PVC (premature ventricular contraction)   . Vitamin D deficiency     Past Surgical History:  Procedure Laterality Date  . TUBAL LIGATION  1969    Allergies  Allergen Reactions  . Sulfa Antibiotics     Outpatient Encounter Medications as of 04/08/2023  Medication Sig  . acetaminophen (TYLENOL) 500 MG tablet Take 1,000 mg by mouth 2 (two) times daily.  Marland Kitchen CALCIUM PO Take 1 tablet by mouth daily.  . Cholecalciferol 1000 units tablet Take 1,000 Units by mouth daily.  Marland Kitchen denosumab (PROLIA) 60 MG/ML SOSY injection Inject 60 mg into the skin every 6 (six)  months.  . diclofenac Sodium (VOLTAREN ARTHRITIS PAIN) 1 % GEL Apply 2 g topically 4 (four) times daily. Apply to right knee or hip as needed   No facility-administered encounter medications on file as of 04/08/2023.    Review of Systems:  Review of Systems  Constitutional:  Negative for activity change and appetite change.  HENT: Negative.    Respiratory:  Negative for cough and shortness of breath.   Cardiovascular:  Negative for leg swelling.  Gastrointestinal:  Negative for constipation.  Genitourinary: Negative.   Musculoskeletal:  Positive for arthralgias, back pain and gait problem. Negative for myalgias.  Skin: Negative.   Neurological:  Negative for dizziness and weakness.  Psychiatric/Behavioral:  Negative for confusion, dysphoric mood and sleep disturbance.     Health Maintenance  Topic Date Due  . Medicare Annual Wellness (AWV)  08/19/2018  . INFLUENZA VACCINE  01/22/2023  . COVID-19 Vaccine (6 - 2023-24 season) 02/22/2023  . Zoster Vaccines- Shingrix (2 of 2) 05/13/2024 (Originally 10/21/2018)  . DTaP/Tdap/Td (2 - Td or Tdap) 02/15/2024  . Pneumonia Vaccine 74+ Years old  Completed  . DEXA SCAN  Completed  . HPV VACCINES  Aged Out    Physical Exam: Vitals:   04/08/23 1041  Pulse: 67  Resp: 17  Temp: 97.8 F (36.6  C)  TempSrc: Temporal  SpO2: 99%  Weight: 90 lb 14.4 oz (41.2 kg)  Height: 4\' 11"  (1.499 m)   Body mass index is 18.36 kg/m. Physical Exam Vitals reviewed.  Constitutional:      Appearance: Normal appearance.  HENT:     Head: Normocephalic.     Nose: Nose normal.     Mouth/Throat:     Mouth: Mucous membranes are moist.     Pharynx: Oropharynx is clear.  Eyes:     Pupils: Pupils are equal, round, and reactive to light.  Cardiovascular:     Rate and Rhythm: Normal rate and regular rhythm.     Pulses: Normal pulses.     Heart sounds: Normal heart sounds. No murmur heard. Pulmonary:     Effort: Pulmonary effort is normal.     Breath  sounds: Normal breath sounds.  Abdominal:     General: Abdomen is flat. Bowel sounds are normal.     Palpations: Abdomen is soft.  Musculoskeletal:        General: No swelling.     Cervical back: Neck supple.  Skin:    General: Skin is warm.  Neurological:     General: No focal deficit present.     Mental Status: She is alert and oriented to person, place, and time.  Psychiatric:        Mood and Affect: Mood normal.        Thought Content: Thought content normal.    Labs reviewed: Basic Metabolic Panel: No results for input(s): "NA", "K", "CL", "CO2", "GLUCOSE", "BUN", "CREATININE", "CALCIUM", "MG", "PHOS", "TSH" in the last 8760 hours. Liver Function Tests: No results for input(s): "AST", "ALT", "ALKPHOS", "BILITOT", "PROT", "ALBUMIN" in the last 8760 hours. No results for input(s): "LIPASE", "AMYLASE" in the last 8760 hours. No results for input(s): "AMMONIA" in the last 8760 hours. CBC: No results for input(s): "WBC", "NEUTROABS", "HGB", "HCT", "MCV", "PLT" in the last 8760 hours. Lipid Panel: No results for input(s): "CHOL", "HDL", "LDLCALC", "TRIG", "CHOLHDL", "LDLDIRECT" in the last 8760 hours. No results found for: "HGBA1C"  Procedures since last visit: No results found.  Assessment/Plan 1. Osteoporosis, unspecified osteoporosis type, unspecified pathological fracture presence We discussed that she does not have options but to stay on Prolia due to risk of compression fractures She does not want to go on Fosamax Her L2 spine was t score -2.8 She does need Repeat DEXA which I will go ahead and Order  2. Chronic midline low back pain without sciatica Doing better with therapy  3. Hyperlipidemia, unspecified hyperlipidemia type On Diet  4. Exudative age-related macular degeneration of right eye with active choroidal neovascularization (HCC)   5. Right knee pain, unspecified chronicity Resolved now    Labs/tests ordered:  * No order type specified * Next appt:   05/28/2023

## 2023-04-29 ENCOUNTER — Other Ambulatory Visit: Payer: Self-pay | Admitting: Internal Medicine

## 2023-04-29 DIAGNOSIS — M810 Age-related osteoporosis without current pathological fracture: Secondary | ICD-10-CM

## 2023-04-29 MED ORDER — DENOSUMAB 60 MG/ML ~~LOC~~ SOSY: 60.0000 mg | PREFILLED_SYRINGE | Freq: Once | SUBCUTANEOUS | Status: AC

## 2023-05-11 ENCOUNTER — Encounter: Payer: Self-pay | Admitting: Podiatry

## 2023-05-11 ENCOUNTER — Ambulatory Visit (INDEPENDENT_AMBULATORY_CARE_PROVIDER_SITE_OTHER): Payer: Medicare Other | Admitting: Podiatry

## 2023-05-11 DIAGNOSIS — L84 Corns and callosities: Secondary | ICD-10-CM | POA: Diagnosis not present

## 2023-05-11 DIAGNOSIS — M79674 Pain in right toe(s): Secondary | ICD-10-CM | POA: Diagnosis not present

## 2023-05-11 DIAGNOSIS — M79675 Pain in left toe(s): Secondary | ICD-10-CM | POA: Diagnosis not present

## 2023-05-11 DIAGNOSIS — B351 Tinea unguium: Secondary | ICD-10-CM

## 2023-05-11 NOTE — Progress Notes (Signed)
  Subjective:  Patient ID: Julia Hinton, female    DOB: 31-Jan-1933,   MRN: [id number]  No chief complaint on file.   89 y.o. female presents for concern of thickened elongated and painful nails that are difficult to trim. Requesting to have them trimmed today. Relates burning and tingling in their feet. Patient is not diabetic Patient with new concern for callus in between her first and second toe on the right.   PCP:  Mahlon Gammon, MD    . Denies any other pedal complaints. Denies n/v/f/c.   Past Medical History:  Diagnosis Date   Osteoporosis    PVC (premature ventricular contraction)    Vitamin D deficiency     Objective:  Physical Exam: Vascular: DP/PT pulses 2/4 bilateral. CFT <3 seconds. Absent hair growth on digits. Edema noted to bilateral lower extremities. Xerosis noted bilaterally.  Skin. No lacerations or abrasions bilateral feet. Nails 2-5 on right and 3-5 on right  are thickened discolored and elongated with subungual debris. Hyperkeratotic tissue noted medial aspect of right second digit.  Musculoskeletal: MMT 5/5 bilateral lower extremities in DF, PF, Inversion and Eversion. Deceased ROM in DF of ankle joint. Hammertoe and bunion deformities noted bilatearl.  Neurological: Sensation intact to light touch. Protective sensation diminished bilateral.    Assessment:   1. Dermatophytosis of nail   2. Pain in toes of both feet   3. Callus of foot         Plan:  Patient was evaluated and treated and all questions answered. -Discussed and educated patient on foot care, especially with  regards to the vascular, neurological and musculoskeletal systems.  -Discussed supportive shoes at all times and checking feet regularly.  -Mechanically debrided all nails 1-5 bilateral using sterile nail nipper and filed with dremel without incident  -Hyperkeratotic tissue debrided with chisel without incident  -Answered all patient questions -Patient to return  in 9 weeks for  at risk foot care -Patient advised to call the office if any problems or questions arise in the meantime.   Louann Sjogren, DPM

## 2023-05-28 ENCOUNTER — Other Ambulatory Visit: Payer: Medicare Other

## 2023-05-29 LAB — CBC WITH DIFFERENTIAL/PLATELET
Absolute Lymphocytes: 1571 {cells}/uL (ref 850–3900)
Absolute Monocytes: 801 {cells}/uL (ref 200–950)
Basophils Absolute: 62 {cells}/uL (ref 0–200)
Basophils Relative: 0.8 %
Eosinophils Absolute: 123 {cells}/uL (ref 15–500)
Eosinophils Relative: 1.6 %
HCT: 41.6 % (ref 35.0–45.0)
Hemoglobin: 13.9 g/dL (ref 11.7–15.5)
MCH: 31.4 pg (ref 27.0–33.0)
MCHC: 33.4 g/dL (ref 32.0–36.0)
MCV: 93.9 fL (ref 80.0–100.0)
MPV: 10.6 fL (ref 7.5–12.5)
Monocytes Relative: 10.4 %
Neutro Abs: 5144 {cells}/uL (ref 1500–7800)
Neutrophils Relative %: 66.8 %
Platelets: 304 10*3/uL (ref 140–400)
RBC: 4.43 10*6/uL (ref 3.80–5.10)
RDW: 12.3 % (ref 11.0–15.0)
Total Lymphocyte: 20.4 %
WBC: 7.7 10*3/uL (ref 3.8–10.8)

## 2023-05-29 LAB — LIPID PANEL
Cholesterol: 202 mg/dL — ABNORMAL HIGH (ref <200)
HDL: 74 mg/dL (ref >=50)
LDL Cholesterol (Calc): 111 mg/dL — ABNORMAL HIGH
Non-HDL Cholesterol (Calc): 128 mg/dL (ref <130)
Total CHOL/HDL Ratio: 2.7 (calc) (ref <5.0)
Triglycerides: 80 mg/dL (ref <150)

## 2023-05-29 LAB — TSH: TSH: 1.88 m[IU]/L (ref 0.40–4.50)

## 2023-06-03 ENCOUNTER — Non-Acute Institutional Stay: Payer: Medicare Other | Admitting: Internal Medicine

## 2023-06-03 ENCOUNTER — Encounter: Payer: Self-pay | Admitting: Internal Medicine

## 2023-06-03 VITALS — BP 124/72 | HR 72 | Temp 98.3°F | Resp 17 | Ht 59.0 in | Wt 92.4 lb

## 2023-06-03 DIAGNOSIS — M545 Low back pain, unspecified: Secondary | ICD-10-CM | POA: Diagnosis not present

## 2023-06-03 DIAGNOSIS — E785 Hyperlipidemia, unspecified: Secondary | ICD-10-CM | POA: Diagnosis not present

## 2023-06-03 DIAGNOSIS — G8929 Other chronic pain: Secondary | ICD-10-CM

## 2023-06-03 DIAGNOSIS — M810 Age-related osteoporosis without current pathological fracture: Secondary | ICD-10-CM | POA: Diagnosis not present

## 2023-06-03 DIAGNOSIS — H353211 Exudative age-related macular degeneration, right eye, with active choroidal neovascularization: Secondary | ICD-10-CM

## 2023-06-03 DIAGNOSIS — R030 Elevated blood-pressure reading, without diagnosis of hypertension: Secondary | ICD-10-CM

## 2023-06-03 NOTE — Patient Instructions (Signed)
Can use Voltaren gel on the back Can take Advil Prn for 2-3 weeks take with food Let us know if need anything stroger

## 2023-06-03 NOTE — Progress Notes (Signed)
Location:  [facility] Service:  Clinic (12)  Provider:   Code Status:  Goals of Care:     06/03/2023   10:57 AM  Advanced Directives  Does Patient Have a Medical Advance Directive? Yes  Type of Estate agent of [place];Living will;Out of facility DNR (pink MOST or yellow form)  Does patient want to make changes to medical advance directive? No - Patient declined  Copy of Healthcare Power of Attorney in Chart? No - copy requested     Chief Complaint  Patient presents with   Medical Management of Chronic Issues    Patient is being seen for a follow up    HPI: Patient is a 89 y.o. female seen today for medical management of chronic diseases.    [place]ives [place] in [facility]   Patient has history Osteoporosis and Right Eye Macular degeneration and Vit D Def     Osteoporosis On Prolia   Low back pain due to L1-5 Stenosis Has seen Dr Ethelene Hal and have it injected 3 times  Non surgical candidate With no relief   Works with therapy now  Recently her husband broke his both hands after fall So she is helping him with everything Now having worsening of her pain in the back Took 2 alleves this morning to help with the pain    Elevated BP Sometimes when she is very Anxious   Right knee pain  No New issue beside the pain due to helping her husband   Past Medical History:  Diagnosis Date   Osteoporosis    PVC (premature ventricular contraction)    Vitamin D deficiency     Past Surgical History:  Procedure Laterality Date   TUBAL LIGATION  1969    Allergies  Allergen Reactions   Sulfa Antibiotics     Outpatient Encounter Medications as of 06/03/2023  Medication Sig   acetaminophen (TYLENOL) 500 MG tablet Take 1,000 mg by mouth 2 (two) times daily.   CALCIUM PO Take 1 tablet by mouth daily.   Cholecalciferol 1000 units tablet Take 1,000 Units by mouth daily.   denosumab (PROLIA) 60 MG/ML SOSY injection Inject 60 mg into the skin  every 6 (six) months.   diclofenac Sodium (VOLTAREN ARTHRITIS PAIN) 1 % GEL Apply 2 g topically 4 (four) times daily. Apply to right knee or hip as needed   Facility-Administered Encounter Medications as of 06/03/2023  Medication   [START ON 06/15/2023] denosumab (PROLIA) injection 60 mg    Review of Systems:  Review of Systems  Constitutional:  Positive for activity change. Negative for appetite change.  HENT: Negative.    Respiratory:  Negative for cough and shortness of breath.   Cardiovascular:  Negative for leg swelling.  Gastrointestinal:  Negative for constipation.  Genitourinary: Negative.   Musculoskeletal:  Positive for back pain and gait problem. Negative for arthralgias and myalgias.  Skin: Negative.   Neurological:  Negative for dizziness and weakness.  Psychiatric/Behavioral:  Negative for confusion, dysphoric mood and sleep disturbance.     Health Maintenance  Topic Date Due   Medicare Annual Wellness (AWV)  08/19/2018   COVID-19 Vaccine (6 - 2023-24 season) 02/22/2023   Zoster Vaccines- Shingrix (2 of 2) 05/13/2024 (Originally 10/21/2018)   DTaP/Tdap/Td (2 - Td or Tdap) 02/15/2024   Pneumonia Vaccine 23+ Years old  Completed   INFLUENZA VACCINE  Completed   DEXA SCAN  Completed   HPV VACCINES  Aged Out  Physical Exam: Vitals:   06/03/23 1055  BP: 124/72  Pulse: 72  Resp: 17  Temp: 98.3 F (36.8 C)  TempSrc: Temporal  SpO2: 97%  Weight: 92 lb 6.4 oz (41.9 kg)  Height: 4\' 11"  (1.499 m)   Body mass index is 18.66 kg/m. Physical Exam Vitals reviewed.  Constitutional:      Appearance: Normal appearance.  HENT:     Head: Normocephalic.     Nose: Nose normal.     Mouth/Throat:     Mouth: Mucous membranes are moist.     Pharynx: Oropharynx is clear.  Eyes:     Pupils: Pupils are equal, round, and reactive to light.  Cardiovascular:     Rate and Rhythm: Normal rate and regular rhythm.     Pulses: Normal pulses.     Heart sounds: Normal heart  sounds. No murmur heard. Pulmonary:     Effort: Pulmonary effort is normal.     Breath sounds: Normal breath sounds.  Abdominal:     General: Abdomen is flat. Bowel sounds are normal.     Palpations: Abdomen is soft.  Musculoskeletal:        General: No swelling.     Cervical back: Neck supple.  Skin:    General: Skin is warm.  Neurological:     General: No focal deficit present.     Mental Status: She is alert and oriented to person, place, and time.  Psychiatric:        Mood and Affect: Mood normal.        Thought Content: Thought content normal.     Labs reviewed: Basic Metabolic Panel: Recent Labs    05/28/23 0821  TSH 1.88   Liver Function Tests: No results for input(s): "AST", "ALT", "ALKPHOS", "BILITOT", "PROT", "ALBUMIN" in the last 8760 hours. No results for input(s): "LIPASE", "AMYLASE" in the last 8760 hours. No results for input(s): "AMMONIA" in the last 8760 hours. CBC: Recent Labs    05/28/23 0821  WBC 7.7  NEUTROABS 5,144  HGB 13.9  HCT 41.6  MCV 93.9  PLT 304   Lipid Panel: Recent Labs    05/28/23 0821  CHOL 202*  HDL 74  LDLCALC 111*  TRIG 80  CHOLHDL 2.7   No results found for: "HGBA1C"  Procedures since last visit: No results found.  Assessment/Plan 1. Chronic midline low back pain without sciatica Can take Advil with food but try to avoid due to side effects Working with therapy Voltaren gel as needed Seen Dr Ethelene Hal who says Conservative management 2. Osteoporosis, unspecified osteoporosis type, unspecified pathological fracture presence Prolia  3. Hyperlipidemia, unspecified hyperlipidemia type On Diet  4. Exudative age-related macular degeneration of right eye with active choroidal neovascularization (HCC)   5. Elevated blood pressure reading Some readings come higher But mostly doing well    Labs/tests ordered:  * No order type specified * Next appt:  06/15/2023

## 2023-06-15 ENCOUNTER — Ambulatory Visit: Payer: Medicare Other

## 2023-06-25 DIAGNOSIS — M546 Pain in thoracic spine: Secondary | ICD-10-CM | POA: Insufficient documentation

## 2023-07-13 ENCOUNTER — Ambulatory Visit: Payer: Medicare Other | Admitting: Podiatry

## 2023-08-05 ENCOUNTER — Ambulatory Visit (INDEPENDENT_AMBULATORY_CARE_PROVIDER_SITE_OTHER): Payer: Medicare Other | Admitting: Podiatry

## 2023-08-05 ENCOUNTER — Encounter: Payer: Self-pay | Admitting: Podiatry

## 2023-08-05 DIAGNOSIS — M79675 Pain in left toe(s): Secondary | ICD-10-CM

## 2023-08-05 DIAGNOSIS — M79674 Pain in right toe(s): Secondary | ICD-10-CM

## 2023-08-05 DIAGNOSIS — L84 Corns and callosities: Secondary | ICD-10-CM

## 2023-08-05 DIAGNOSIS — B351 Tinea unguium: Secondary | ICD-10-CM | POA: Diagnosis not present

## 2023-08-05 NOTE — Progress Notes (Signed)
  Subjective:  Patient ID: Julia Hinton, female    DOB: Nov 28, 1932,   MRN: [id number]  No chief complaint on file.   89 y.o. female presents for concern of thickened elongated and painful nails that are difficult to trim. Requesting to have them trimmed today. Relates burning and tingling in their feet. Patient is not diabetic Patient with new concern for callus in between her first and second toe on the right.   PCP:  Mahlon Gammon, MD    . Denies any other pedal complaints. Denies n/v/f/c.   Past Medical History:  Diagnosis Date   Osteoporosis    PVC (premature ventricular contraction)    Vitamin D deficiency     Objective:  Physical Exam: Vascular: DP/PT pulses 2/4 bilateral. CFT <3 seconds. Absent hair growth on digits. Edema noted to bilateral lower extremities. Xerosis noted bilaterally.  Skin. No lacerations or abrasions bilateral feet. Nails 2-5 on right and 3-5 on right  are thickened discolored and elongated with subungual debris. Hyperkeratotic tissue noted medial aspect of right second digit.  Musculoskeletal: MMT 5/5 bilateral lower extremities in DF, PF, Inversion and Eversion. Deceased ROM in DF of ankle joint. Hammertoe and bunion deformities noted bilatearl.  Neurological: Sensation intact to light touch. Protective sensation diminished bilateral.    Assessment:   1. Dermatophytosis of nail   2. Pain in toes of both feet         Plan:  Patient was evaluated and treated and all questions answered. -Discussed and educated patient on foot care, especially with  regards to the vascular, neurological and musculoskeletal systems.  -Discussed supportive shoes at all times and checking feet regularly.  -Mechanically debrided all nails 1-5 bilateral using sterile nail nipper and filed with dremel without incident  -Hyperkeratotic tissue debrided with chisel without incident  -Answered all patient questions -Patient to return  in 9 weeks for at risk foot  care -Patient advised to call the office if any problems or questions arise in the meantime.   Louann Sjogren, DPM

## 2023-08-12 ENCOUNTER — Telehealth: Payer: Self-pay | Admitting: *Deleted

## 2023-08-12 ENCOUNTER — Other Ambulatory Visit: Payer: Self-pay | Admitting: Internal Medicine

## 2023-08-12 DIAGNOSIS — M810 Age-related osteoporosis without current pathological fracture: Secondary | ICD-10-CM

## 2023-08-12 DIAGNOSIS — R600 Localized edema: Secondary | ICD-10-CM

## 2023-08-12 NOTE — Telephone Encounter (Signed)
Called and spoke with patient. Lab appointment scheduled for Monday 08/17/2023 @ 7:50am for [employer].   Message sent to Dr. Chales Abrahams and she will place an order

## 2023-08-12 NOTE — Telephone Encounter (Signed)
-----   Message from Mahlon Gammon sent at 08/12/2023 11:18 AM EST ----- Lets schedule her for labs in [facility] on mon and then she gets her prolia shot whenever you can next week.Thanks you. Once you have her schedule I can put labs for her  Geralynn Rile ----- Message ----- From: Yesena Reaves A, CMA Sent: 08/12/2023  10:44 AM EST To: Mahlon Gammon, MD  Ok. Do you want her to go ahead and have those 12/11 labs done now or do you want to place a new order for her to have lab done just for her Prolia? ----- Message ----- From: Mahlon Gammon, MD Sent: 08/12/2023   9:47 AM EST To: Beckey Downing Takeshi Teasdale, CMA  Sorry I take it back There are no labs since 10/24 So she needs to be scheduled for labs here and then Proilia shot

## 2023-08-17 ENCOUNTER — Other Ambulatory Visit: Payer: Medicare Other

## 2023-08-17 LAB — COMPLETE METABOLIC PANEL WITH GFR
AG Ratio: 2.3 (calc) (ref 1.0–2.5)
ALT: 18 U/L (ref 6–29)
AST: 26 U/L (ref 10–35)
Albumin: 4.3 g/dL (ref 3.6–5.1)
Alkaline phosphatase (APISO): 53 U/L (ref 37–153)
BUN: 19 mg/dL (ref 7–25)
CO2: 31 mmol/L (ref 20–32)
Calcium: 9.8 mg/dL (ref 8.6–10.4)
Chloride: 103 mmol/L (ref 98–110)
Creat: 0.67 mg/dL (ref 0.60–0.95)
Globulin: 1.9 g/dL (ref 1.9–3.7)
Glucose, Bld: 78 mg/dL (ref 65–99)
Potassium: 4.6 mmol/L (ref 3.5–5.3)
Sodium: 142 mmol/L (ref 135–146)
Total Bilirubin: 0.4 mg/dL (ref 0.2–1.2)
Total Protein: 6.2 g/dL (ref 6.1–8.1)
eGFR: 84 mL/min/{1.73_m2} (ref >=60)

## 2023-08-17 LAB — CBC WITH DIFFERENTIAL/PLATELET
Absolute Lymphocytes: 1525 {cells}/uL (ref 850–3900)
Absolute Monocytes: 528 {cells}/uL (ref 200–950)
Basophils Absolute: 59 {cells}/uL (ref 0–200)
Basophils Relative: 0.9 %
Eosinophils Absolute: 172 {cells}/uL (ref 15–500)
Eosinophils Relative: 2.6 %
HCT: 42.8 % (ref 35.0–45.0)
Hemoglobin: 14.2 g/dL (ref 11.7–15.5)
MCH: 30.1 pg (ref 27.0–33.0)
MCHC: 33.2 g/dL (ref 32.0–36.0)
MCV: 90.7 fL (ref 80.0–100.0)
MPV: 10.7 fL (ref 7.5–12.5)
Monocytes Relative: 8 %
Neutro Abs: 4316 {cells}/uL (ref 1500–7800)
Neutrophils Relative %: 65.4 %
Platelets: 317 10*3/uL (ref 140–400)
RBC: 4.72 10*6/uL (ref 3.80–5.10)
RDW: 12.4 % (ref 11.0–15.0)
Total Lymphocyte: 23.1 %
WBC: 6.6 10*3/uL (ref 3.8–10.8)

## 2023-08-17 LAB — VITAMIN D 25 HYDROXY (VIT D DEFICIENCY, FRACTURES): Vit D, 25-Hydroxy: 60 ng/mL (ref 30–100)

## 2023-09-15 MED ORDER — DENOSUMAB 60 MG/ML ~~LOC~~ SOSY
60.0000 mg | PREFILLED_SYRINGE | Freq: Once | SUBCUTANEOUS | Status: AC
  Administered 2023-09-21: 60 mg via SUBCUTANEOUS

## 2023-09-15 NOTE — Addendum Note (Signed)
 Addended by: Nelda Severe A on: 09/15/2023 03:57 PM   Modules accepted: Orders

## 2023-09-15 NOTE — Telephone Encounter (Signed)
 [employer] Verified. Spoke with Rep. French Ana with [employer] she stated that I would need to Call [employer] to Verify that patient was Active.   Called [employer] and patients Policy is Active since 12/02/2010. No PreCert Needed. No Reference number. Automative system.    Called patient and scheduled Prolia appointment for 09/21/2023 Labs completed.

## 2023-09-21 ENCOUNTER — Ambulatory Visit (INDEPENDENT_AMBULATORY_CARE_PROVIDER_SITE_OTHER)

## 2023-09-21 DIAGNOSIS — M810 Age-related osteoporosis without current pathological fracture: Secondary | ICD-10-CM

## 2023-09-21 MED ORDER — DENOSUMAB 60 MG/ML ~~LOC~~ SOSY
60.0000 mg | PREFILLED_SYRINGE | SUBCUTANEOUS | Status: AC
  Administered 2024-03-28: 60 mg via SUBCUTANEOUS

## 2023-09-21 NOTE — Progress Notes (Signed)
 Patient is in office today for a nurse visit for  Prolia Injection . Patient Injection was given in the  Right arm. Patient tolerated injection well.

## 2023-10-12 ENCOUNTER — Ambulatory Visit (INDEPENDENT_AMBULATORY_CARE_PROVIDER_SITE_OTHER): Payer: Medicare Other | Admitting: Podiatry

## 2023-10-12 DIAGNOSIS — M79675 Pain in left toe(s): Secondary | ICD-10-CM

## 2023-10-12 DIAGNOSIS — L84 Corns and callosities: Secondary | ICD-10-CM

## 2023-10-12 DIAGNOSIS — B351 Tinea unguium: Secondary | ICD-10-CM | POA: Diagnosis not present

## 2023-10-12 DIAGNOSIS — M79674 Pain in right toe(s): Secondary | ICD-10-CM | POA: Diagnosis not present

## 2023-10-12 NOTE — Progress Notes (Signed)
  Subjective:  Patient ID: Julia Hinton, female    DOB: [birth year],   MRN: [id number]  Chief Complaint  Patient presents with   Nail Problem    Nail trim     89 y.o. female presents for concern of thickened elongated and painful nails that are difficult to trim. Requesting to have them trimmed today. Relates burning and tingling in their feet. Patient is not diabetic Patient with new concern for callus in between her first and second toe on the right.   PCP:  Marguerite Shiley, MD    . Denies any other pedal complaints. Denies n/v/f/c.   Past Medical History:  Diagnosis Date   Osteoporosis    PVC (premature ventricular contraction)    Vitamin D  deficiency     Objective:  Physical Exam: Vascular: DP/PT pulses 2/4 bilateral. CFT <3 seconds. Absent hair growth on digits. Edema noted to bilateral lower extremities. Xerosis noted bilaterally.  Skin. No lacerations or abrasions bilateral feet. Nails 2-5 on right and 3-5 on right  are thickened discolored and elongated with subungual debris. Hyperkeratotic tissue noted medial aspect of right second digit. Hyperkeratotic lesion noted medial first metatarsal head bilateral.  Musculoskeletal: MMT 5/5 bilateral lower extremities in DF, PF, Inversion and Eversion. Deceased ROM in DF of ankle joint. Hammertoe and bunion deformities noted bilatearl.  Neurological: Sensation intact to light touch. Protective sensation diminished bilateral.    Assessment:   1. Dermatophytosis of nail   2. Pain in toes of both feet         Plan:  Patient was evaluated and treated and all questions answered. -Discussed and educated patient on foot care, especially with  regards to the vascular, neurological and musculoskeletal systems.  -Discussed supportive shoes at all times and checking feet regularly.  -Mechanically debrided all nails 1-5 bilateral using sterile nail nipper and filed with dremel without incident  -Hyperkeratotic tissue debrided with  chisel without incident x 2 to medial first metatarsal hads.  -Answered all patient questions -Patient to return  in 9 weeks for at risk foot care -Patient advised to call the office if any problems or questions arise in the meantime.   Jennefer Moats, DPM

## 2023-11-03 ENCOUNTER — Other Ambulatory Visit: Payer: Self-pay | Admitting: Internal Medicine

## 2023-11-03 DIAGNOSIS — Z1231 Encounter for screening mammogram for malignant neoplasm of breast: Secondary | ICD-10-CM

## 2023-11-16 ENCOUNTER — Other Ambulatory Visit: Payer: Self-pay

## 2023-11-16 ENCOUNTER — Emergency Department (HOSPITAL_COMMUNITY)
Admission: EM | Admit: 2023-11-16 | Discharge: 2023-11-16 | Disposition: A | Discharge status: Home / Self Care | Attending: Emergency Medicine | Admitting: Emergency Medicine

## 2023-11-16 DIAGNOSIS — X58XXXA Exposure to other specified factors, initial encounter: Secondary | ICD-10-CM | POA: Diagnosis not present

## 2023-11-16 DIAGNOSIS — I83892 Varicose veins of left lower extremities with other complications: Secondary | ICD-10-CM | POA: Diagnosis present

## 2023-11-16 DIAGNOSIS — S81812A Laceration without foreign body, left lower leg, initial encounter: Secondary | ICD-10-CM | POA: Insufficient documentation

## 2023-11-16 NOTE — ED Notes (Signed)
 Report given to [facility] .

## 2023-11-16 NOTE — ED Triage Notes (Signed)
 [facility] due to leg/ankle bleeding. Patient woke up to use the bathroom when she noticed a varicose vein in the left medial ankle bleeding. Patient denies pain. Patient states this has happened before years ago. Patient is A&Ox4. VSS w/ EMS.

## 2023-11-16 NOTE — ED Provider Notes (Signed)
 [facility] EMERGENCY DEPARTMENT AT [facility] Provider Note   CSN: [id number] Arrival date & time: 11/16/23  0220     History  Chief Complaint  Patient presents with   Leg Bleeding    Julia Hinton is a 89 y.o. female.  The history is provided by the patient and the EMS personnel.  Julia Hinton is a 89 y.o. female who presents to the Emergency Department complaining of flank bleeding.  She presents to the emergency department by EMS for evaluation of bleeding from her left leg.  She was in her routine state of health when she went to bed at night.  She woke up she had bleeding from a varicose vein.  She is unsure how long it had been bleeding.  No shortness of breath, chest pain, weakness.  No history of bleeding issues.  She does not take any blood thinners.     Home Medications Prior to Admission medications   Medication Sig Start Date End Date Taking? Authorizing Provider  acetaminophen (TYLENOL) 500 MG tablet Take 1,000 mg by mouth 2 (two) times daily.    [provider]  CALCIUM PO Take 1 tablet by mouth daily.    [provider]  Cholecalciferol 1000 units tablet Take 1,000 Units by mouth daily.    [provider]  denosumab  (PROLIA ) 60 MG/ML SOSY injection Inject 60 mg into the skin every 6 (six) months. 10/19/19   Marguerite Shiley, MD  diclofenac  Sodium (VOLTAREN  ARTHRITIS PAIN) 1 % GEL Apply 2 g topically 4 (four) times daily. Apply to right knee or hip as needed 01/19/23   Arnetha Bhat, NP      Allergies    Sulfa antibiotics    Review of Systems   Review of Systems  All other systems reviewed and are negative.   Physical Exam Updated Vital Signs BP (!) 155/59   Pulse 82   Temp 98 F (36.7 C) (Oral)   Ht 4\' 11"  (1.499 m)   Wt 39.9 kg   SpO2 99%   BMI 17.77 kg/m  Physical Exam Vitals and nursing note reviewed.  Constitutional:      Appearance: She is well-developed.  HENT:     Head: Normocephalic and atraumatic.   Cardiovascular:     Rate and Rhythm: Normal rate and regular rhythm.  Pulmonary:     Effort: Pulmonary effort is normal. No respiratory distress.  Abdominal:     Palpations: Abdomen is soft.     Tenderness: There is no abdominal tenderness. There is no guarding or rebound.  Musculoskeletal:        General: No tenderness.     Comments: Punctate area of prior bleeding to the left inner ankle without surrounding erythema or edema.  No local tenderness  Skin:    General: Skin is warm and dry.  Neurological:     Mental Status: She is alert and oriented to person, place, and time.  Psychiatric:        Behavior: Behavior normal.     ED Results / Procedures / Treatments   Labs (all labs ordered are listed, but only abnormal results are displayed) Labs Reviewed - No data to display  EKG None  Radiology No results found.  Procedures .Laceration Repair  Date/Time: 11/16/2023 3:38 AM  Performed by: Kelsey Patricia, MD Authorized by: Kelsey Patricia, MD   Consent:    Consent obtained:  Verbal   Risks discussed:  Infection and pain   Alternatives discussed:  No treatment, delayed treatment and observation Universal protocol:    Patient identity confirmed:  Verbally with patient Anesthesia:    Anesthesia method:  None Laceration details:    Location:  Leg   Leg location:  L lower leg   Length (cm):  0.2 Exploration:    Hemostasis achieved with:  Direct pressure   Imaging outcome: foreign body noted   Treatment:    Area cleansed with:  Chlorhexidine   Amount of cleaning:  Standard Skin repair:    Repair method:  Tissue adhesive     Medications Ordered in ED Medications - No data to display  ED Course/ Medical Decision Making/ A&P                                 Medical Decision Making  [facility] of spontaneous bleeding from varicose vein to left ankle.  She has a punctate area of prior bleeding to the left medial  ankle that is hemostatic.  Wound was cleansed with chlorhexidine and tissue adhesive was applied to help prevent re-bleeding.  Following this patient was observed in the ED and able to ambulate without recurrent bleeding.  Pt is asymptomatic.  Recent labs from February with normal renal function and cbc.  No concern for coagulopathy at this time.  Discussed home care for bleeding varicosity with outpatient follow up and return precautions.          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

## 2023-11-17 ENCOUNTER — Inpatient Hospital Stay: Admission: RE | Admit: 2023-11-17 | Payer: Medicare Other | Source: Ambulatory Visit

## 2023-11-18 ENCOUNTER — Ambulatory Visit: Admitting: Internal Medicine

## 2023-11-18 ENCOUNTER — Encounter: Payer: Self-pay | Admitting: Internal Medicine

## 2023-11-18 VITALS — BP 130/60 | HR 56 | Temp 97.3°F | Ht 59.0 in | Wt 88.0 lb

## 2023-11-18 DIAGNOSIS — I83899 Varicose veins of unspecified lower extremities with other complications: Secondary | ICD-10-CM

## 2023-11-18 DIAGNOSIS — E785 Hyperlipidemia, unspecified: Secondary | ICD-10-CM

## 2023-11-18 DIAGNOSIS — M810 Age-related osteoporosis without current pathological fracture: Secondary | ICD-10-CM | POA: Diagnosis not present

## 2023-11-18 NOTE — Patient Instructions (Addendum)
 [facility] [address]  [phone]  Please come to [facility] on Monday 03/14/2024 for labs at 7:45am.

## 2023-11-18 NOTE — Progress Notes (Unsigned)
 Location:  [facility] curator of Service:   Clinic  Provider:   Code Status:  Goals of Care:     11/16/2023    2:25 AM  Advanced Directives  Does Patient Have a Medical Advance Directive? Yes  Type of Estate agent of [place];Living will     Chief Complaint  Patient presents with  . Hospitalization Follow-up    Vein rupture, pt would like referral to vascular clinic      HPI: Patient is a 89 y.o. female seen today for an acute visit for ED follow up  Lives [place] in [facility]   Patient has history Osteoporosis and Right Eye Macular degeneration and Vit D Def  She had episode of Bleeding from her Varicose Veins on 11/16/23  Patient says she woke up in the midle of the Night and saw lot of blood coming from her leg Went to ED and had Tissue Adhesive placed She came for follow up and want referral to vein specialist She is doing well No More bleeding  No Dizziness or any other symptoms right now Orthostatics negative     Past Medical History:  Diagnosis Date  . Osteoporosis   . PVC (premature ventricular contraction)   . Vitamin D  deficiency     Past Surgical History:  Procedure Laterality Date  . TUBAL LIGATION  1969    Allergies  Allergen Reactions  . Sulfa Antibiotics     Outpatient Encounter Medications as of 11/18/2023  Medication Sig  . CALCIUM PO Take 1 tablet by mouth daily.  . Cholecalciferol 1000 units tablet Take 1,000 Units by mouth daily.  . denosumab  (PROLIA ) 60 MG/ML SOSY injection Inject 60 mg into the skin every 6 (six) months.  Aaron Aas acetaminophen (TYLENOL) 500 MG tablet Take 1,000 mg by mouth 2 (two) times daily. (Patient not taking: Reported on 11/18/2023)  . diclofenac  Sodium (VOLTAREN  ARTHRITIS PAIN) 1 % GEL Apply 2 g topically 4 (four) times daily. Apply to right knee or hip as needed (Patient not taking: Reported on 11/18/2023)   Facility-Administered Encounter Medications as of 11/18/2023  Medication  .  denosumab  (PROLIA ) injection 60 mg  . [START ON 03/19/2024] denosumab  (PROLIA ) injection 60 mg    Review of Systems:  Review of Systems  Constitutional:  Negative for activity change and appetite change.  HENT: Negative.    Respiratory:  Negative for cough and shortness of breath.   Cardiovascular:  Negative for leg swelling.  Gastrointestinal:  Negative for constipation.  Genitourinary: Negative.   Musculoskeletal:  Positive for gait problem. Negative for arthralgias and myalgias.  Skin: Negative.   Neurological:  Negative for dizziness and weakness.  Psychiatric/Behavioral:  Negative for confusion, dysphoric mood and sleep disturbance.     Health Maintenance  Topic Date Due  . Medicare Annual Wellness (AWV)  08/19/2018  . COVID-19 Vaccine (6 - 2024-25 season) 02/22/2023  . Zoster Vaccines- Shingrix (2 of 2) 05/13/2024 (Originally 10/21/2018)  . INFLUENZA VACCINE  01/22/2024  . DTaP/Tdap/Td (2 - Td or Tdap) 02/15/2024  . Pneumonia Vaccine 36+ Years old  Completed  . DEXA SCAN  Completed  . HPV VACCINES  Aged Out  . Meningococcal B Vaccine  Aged Out    Physical Exam: Vitals:   11/18/23 1037 11/18/23 1047  BP: 130/60   Pulse: (!) 36 (!) 56  Temp: (!) 97.3 F (36.3 C)   SpO2: (!) 76%   Weight: 87 lb 15.4 oz (39.9 kg)  Height: 4\' 11"  (1.499 m)    Body mass index is 17.77 kg/m. Physical Exam Vitals reviewed.  Constitutional:      Appearance: Normal appearance.  HENT:     Head: Normocephalic.     Nose: Nose normal.     Mouth/Throat:     Mouth: Mucous membranes are moist.     Pharynx: Oropharynx is clear.  Eyes:     Pupils: Pupils are equal, round, and reactive to light.  Cardiovascular:     Rate and Rhythm: Normal rate and regular rhythm.     Pulses: Normal pulses.     Heart sounds: Normal heart sounds. No murmur heard. Pulmonary:     Effort: Pulmonary effort is normal.     Breath sounds: Normal breath sounds.  Abdominal:     General: Abdomen is flat. Bowel  sounds are normal.     Palpations: Abdomen is soft.  Musculoskeletal:        General: No swelling.     Cervical back: Neck supple.     Comments: Varicose Veins in her Left Leg No Bleeding from that area  Skin:    General: Skin is warm.  Neurological:     General: No focal deficit present.     Mental Status: She is alert and oriented to person, place, and time.  Psychiatric:        Mood and Affect: Mood normal.        Thought Content: Thought content normal.    Labs reviewed: Basic Metabolic Panel: Recent Labs    05/28/23 0821 08/17/23 0817  NA  --  142  K  --  4.6  CL  --  103  CO2  --  31  GLUCOSE  --  78  BUN  --  19  CREATININE  --  0.67  CALCIUM  --  9.8  TSH 1.88  --    Liver Function Tests: Recent Labs    08/17/23 0817  AST 26  ALT 18  BILITOT 0.4  PROT 6.2   No results for input(s): "LIPASE", "AMYLASE" in the last 8760 hours. No results for input(s): "AMMONIA" in the last 8760 hours. CBC: Recent Labs    05/28/23 0821 08/17/23 0817  WBC 7.7 6.6  NEUTROABS 5,144 4,316  HGB 13.9 14.2  HCT 41.6 42.8  MCV 93.9 90.7  PLT 304 317   Lipid Panel: Recent Labs    05/28/23 0821  CHOL 202*  HDL 74  LDLCALC 111*  TRIG 80  CHOLHDL 2.7   No results found for: "HGBA1C"  Procedures since last visit: No results found.  Assessment/Plan 1. Bleeding from varicose vein (Primary)  - Ambulatory referral to Vascular Surgery 2 Osteoporosis Continue Prolia  with [facility] Labs before in Sept   Labs/tests ordered:  * No order type specified * Next appt:  11/26/2023

## 2023-11-19 ENCOUNTER — Ambulatory Visit
Admission: RE | Admit: 2023-11-19 | Discharge: 2023-11-19 | Disposition: A | Discharge status: Home / Self Care | Source: Ambulatory Visit | Attending: Internal Medicine | Admitting: Internal Medicine

## 2023-11-19 DIAGNOSIS — Z1231 Encounter for screening mammogram for malignant neoplasm of breast: Secondary | ICD-10-CM

## 2023-11-19 DIAGNOSIS — M810 Age-related osteoporosis without current pathological fracture: Secondary | ICD-10-CM

## 2023-11-24 ENCOUNTER — Ambulatory Visit: Payer: Self-pay | Admitting: Internal Medicine

## 2023-11-26 ENCOUNTER — Ambulatory Visit: Payer: Self-pay | Admitting: Internal Medicine

## 2023-11-26 ENCOUNTER — Other Ambulatory Visit: Payer: Medicare Other

## 2023-11-26 LAB — LIPID PANEL
Cholesterol: 191 mg/dL (ref <200)
HDL: 70 mg/dL (ref >=50)
LDL Cholesterol (Calc): 100 mg/dL — ABNORMAL HIGH
Non-HDL Cholesterol (Calc): 121 mg/dL (ref <130)
Total CHOL/HDL Ratio: 2.7 (calc) (ref <5.0)
Triglycerides: 116 mg/dL (ref <150)

## 2023-11-26 LAB — CBC WITH DIFFERENTIAL/PLATELET
Absolute Lymphocytes: 1573 {cells}/uL (ref 850–3900)
Absolute Monocytes: 737 {cells}/uL (ref 200–950)
Basophils Absolute: 38 {cells}/uL (ref 0–200)
Basophils Relative: 0.5 %
Eosinophils Absolute: 198 {cells}/uL (ref 15–500)
Eosinophils Relative: 2.6 %
HCT: 35.6 % (ref 35.0–45.0)
Hemoglobin: 11.6 g/dL — ABNORMAL LOW (ref 11.7–15.5)
MCH: 30 pg (ref 27.0–33.0)
MCHC: 32.6 g/dL (ref 32.0–36.0)
MCV: 92 fL (ref 80.0–100.0)
MPV: 10.5 fL (ref 7.5–12.5)
Monocytes Relative: 9.7 %
Neutro Abs: 5054 {cells}/uL (ref 1500–7800)
Neutrophils Relative %: 66.5 %
Platelets: 347 10*3/uL (ref 140–400)
RBC: 3.87 10*6/uL (ref 3.80–5.10)
RDW: 12.8 % (ref 11.0–15.0)
Total Lymphocyte: 20.7 %
WBC: 7.6 10*3/uL (ref 3.8–10.8)

## 2023-11-26 LAB — COMPLETE METABOLIC PANEL WITHOUT GFR
AG Ratio: 2.1 (calc) (ref 1.0–2.5)
ALT: 17 U/L (ref 6–29)
AST: 21 U/L (ref 10–35)
Albumin: 4.1 g/dL (ref 3.6–5.1)
Alkaline phosphatase (APISO): 46 U/L (ref 37–153)
BUN: 17 mg/dL (ref 7–25)
CO2: 31 mmol/L (ref 20–32)
Calcium: 9.5 mg/dL (ref 8.6–10.4)
Chloride: 101 mmol/L (ref 98–110)
Creat: 0.73 mg/dL (ref 0.60–0.95)
Globulin: 2 g/dL (ref 1.9–3.7)
Glucose, Bld: 79 mg/dL (ref 65–99)
Potassium: 4.5 mmol/L (ref 3.5–5.3)
Sodium: 140 mmol/L (ref 135–146)
Total Bilirubin: 0.4 mg/dL (ref 0.2–1.2)
Total Protein: 6.1 g/dL (ref 6.1–8.1)

## 2023-11-26 LAB — TSH: TSH: 2.2 m[IU]/L (ref 0.40–4.50)

## 2023-12-02 ENCOUNTER — Encounter: Payer: Medicare Other | Admitting: Internal Medicine

## 2023-12-14 ENCOUNTER — Encounter: Payer: Self-pay | Admitting: Podiatry

## 2023-12-14 ENCOUNTER — Ambulatory Visit (INDEPENDENT_AMBULATORY_CARE_PROVIDER_SITE_OTHER): Admitting: Podiatry

## 2023-12-14 DIAGNOSIS — B351 Tinea unguium: Secondary | ICD-10-CM

## 2023-12-14 DIAGNOSIS — M79675 Pain in left toe(s): Secondary | ICD-10-CM | POA: Diagnosis not present

## 2023-12-14 DIAGNOSIS — M79674 Pain in right toe(s): Secondary | ICD-10-CM

## 2023-12-14 DIAGNOSIS — L84 Corns and callosities: Secondary | ICD-10-CM

## 2023-12-14 NOTE — Progress Notes (Signed)
  Subjective:  Patient ID: Julia Hinton, female    DOB: 1932/08/20,   MRN: [id number]  Chief Complaint  Patient presents with   Debridement    Trim toenails/calluses    89 y.o. female presents for concern of thickened elongated and painful nails that are difficult to trim. Requesting to have them trimmed today. Relates burning and tingling in their feet. Patient is not diabetic Patient with new concern for callus in between her first and second toe on the right.   PCP:  Charlanne Fredia CROME, MD    . Denies any other pedal complaints. Denies n/v/f/c.   Past Medical History:  Diagnosis Date   Osteoporosis    PVC (premature ventricular contraction)    Vitamin D  deficiency     Objective:  Physical Exam: Vascular: DP/PT pulses 2/4 bilateral. CFT <3 seconds. Absent hair growth on digits. Edema noted to bilateral lower extremities. Xerosis noted bilaterally.  Skin. No lacerations or abrasions bilateral feet. Nails 2-5 on right and 3-5 on right  are thickened discolored and elongated with subungual debris. Hyperkeratotic tissue noted medial aspect of right second digit. Hyperkeratotic lesion noted medial first metatarsal head bilateral.  Musculoskeletal: MMT 5/5 bilateral lower extremities in DF, PF, Inversion and Eversion. Deceased ROM in DF of ankle joint. Hammertoe and bunion deformities noted bilatearl.  Neurological: Sensation intact to light touch. Protective sensation diminished bilateral.    Assessment:   1. Dermatophytosis of nail   2. Pain in toes of both feet   3. Callus of foot         Plan:  Patient was evaluated and treated and all questions answered. -Discussed and educated patient on foot care, especially with  regards to the vascular, neurological and musculoskeletal systems.  -Discussed supportive shoes at all times and checking feet regularly.  -Mechanically debrided all nails 1-5 bilateral using sterile nail nipper and filed with dremel without incident   -Hyperkeratotic tissue debrided with chisel without incident x 2 to medial first metatarsal hads.  -Answered all patient questions -Patient to return  in 10 weeks for at risk foot care -Patient advised to call the office if any problems or questions arise in the meantime.   Asberry Failing, DPM

## 2024-01-08 ENCOUNTER — Other Ambulatory Visit: Payer: Self-pay | Admitting: Vascular Surgery

## 2024-01-08 DIAGNOSIS — I83899 Varicose veins of unspecified lower extremities with other complications: Secondary | ICD-10-CM

## 2024-01-25 NOTE — Progress Notes (Unsigned)
 [facility] [place]  ASSESSMENT / PLAN: Julia Hinton is a 89 y.o. female with chronic venous insufficiency of bilateral lower extremities.  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  No significant findings on venous duplex. Counseled patient of limited options for intervention to reduce her risk of bleeding Will ask our sclerotherapy nurse to evaluate her. Follow-up with me as needed.  CHIEF COMPLAINT: Spontaneous bleeding from reticular veins  HISTORY OF PRESENT ILLNESS: Julia Hinton is a 89 y.o. female referred to clinic for evaluation of spontaneous bleeding forearm reticular veins about the ankle on the left side.  The patient has had 2 episodes of spontaneous bleeding from the same point in her left ankle just above the medial malleolus.  This is an nest of corona phlebectatica.  The patient is elderly, but mentally very sharp.  She is hopeful for an intervention to reduce her risk of rebleeding.  Unfortunately, I do not think such an intervention exist for her.  I did counsel her about footcare, and importance of avoiding prolonged soaking and trauma to the area.   Past Medical History:  Diagnosis Date   Osteoporosis    PVC (premature ventricular contraction)    Vitamin D  deficiency     Past Surgical History:  Procedure Laterality Date   TUBAL LIGATION  1969    Family History  Problem Relation Age of Onset   Dementia Mother    Osteoporosis Mother    Cancer Sister    Breast cancer Sister     Social History   Socioeconomic History   Marital status: Married    Spouse name: [name]   Number of children: 4   Years of education: 4   Highest education level: Not on file  Occupational History   Occupation: Retired Engineer, site  Tobacco Use   Smoking status: Never   Smokeless tobacco: Never  Vaping Use   Vaping status: Never Used  Substance and Sexual Activity   Alcohol use: Yes    Alcohol/week: 2.0 standard  drinks of alcohol    Types: 2 Glasses of wine per week   Drug use: No   Sexual activity: Never  Other Topics Concern   Not on file  Social History Narrative   Married 1961-[name]   4 Children (Johnny, [place], [place], [place])   [place] at [facility]   2 glasses red wine/week   Coffee with caffeine   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   Walks a little everyday   Living Will, DNR and HCPOA per patient   + glasses      Social Drivers of Health   Financial Resource Strain: Low Risk  (08/19/2017)   Overall Financial Resource Strain (CARDIA)    Difficulty of Paying Living Expenses: Not hard at all  Food Insecurity: No Food Insecurity (08/19/2017)   Hunger Vital Sign    Worried About Running Out of Food in the Last Year: Never true    Ran Out of Food in the Last Year: Never true  Transportation Needs: No Transportation Needs (08/19/2017)   PRAPARE - Administrator, Civil Service (Medical): No    Lack of Transportation (Non-Medical): No  Physical Activity: Insufficiently Active (08/19/2017)   Exercise Vital Sign    Days of Exercise per Week: 5 days    Minutes of Exercise per Session: 20 min  Stress: No Stress Concern Present (08/19/2017)   [employer] of Occupational Health - Occupational Stress Questionnaire  Feeling of Stress : Not at all  Social Connections: Socially Integrated (08/19/2017)   Social Connection and Isolation Panel    Frequency of Communication with Friends and Family: More than three times a week    Frequency of Social Gatherings with Friends and Family: More than three times a week    Attends Religious Services: More than 4 times per year    Active Member of [employer] or Organizations: Yes    Attends Engineer, structural: More than 4 times per year    Marital Status: Married  Catering manager Violence: Not At Risk (08/19/2017)   Humiliation, Afraid, Rape, and Kick questionnaire    Fear of Current or Ex-Partner: No    Emotionally Abused:  No    Physically Abused: No    Sexually Abused: No    Allergies  Allergen Reactions   Sulfa Antibiotics     Current Outpatient Medications  Medication Sig Dispense Refill   acetaminophen (TYLENOL) 500 MG tablet Take 1,000 mg by mouth 2 (two) times daily. (Patient not taking: Reported on 11/18/2023)     CALCIUM PO Take 1 tablet by mouth daily.     Cholecalciferol 1000 units tablet Take 1,000 Units by mouth daily.     denosumab  (PROLIA ) 60 MG/ML SOSY injection Inject 60 mg into the skin every 6 (six) months. 180 mL 0   diclofenac  Sodium (VOLTAREN  ARTHRITIS PAIN) 1 % GEL Apply 2 g topically 4 (four) times daily. Apply to right knee or hip as needed (Patient not taking: Reported on 11/18/2023) 2 g 5   meloxicam (MOBIC) 7.5 MG tablet Take 1 tablet every day by oral route for 14 days.     tiZANidine (ZANAFLEX) 2 MG tablet Take 1 tablet every 6 hours by oral route as needed for 10 days.     Current Facility-Administered Medications  Medication Dose Route Frequency Provider Last Rate Last Admin   denosumab  (PROLIA ) injection 60 mg  60 mg Subcutaneous Once Gupta, Anjali L, MD       [START ON 03/19/2024] denosumab  (PROLIA ) injection 60 mg  60 mg Subcutaneous Q6 months Charlanne Fredia CROME, MD        PHYSICAL EXAM There were no vitals filed for this visit.  Elderly woman in no distress Regular rate and rhythm Unlabored breathing Palpable dorsalis pedis pulses bilaterally Marked corona phlebectatica of bilateral ankles, left worse than right Scab present in the medial ankle just above the malleolus, which the patient reports is the site of prior bleeding   PERTINENT LABORATORY AND RADIOLOGIC DATA  Most recent CBC    Latest Ref Rng & Units 11/26/2023    8:10 AM 08/17/2023    8:17 AM 05/28/2023    8:21 AM  CBC  WBC 3.8 - 10.8 Thousand/uL 7.6  6.6  7.7   Hemoglobin 11.7 - 15.5 g/dL 88.3  85.7  86.0   Hematocrit 35.0 - 45.0 % 35.6  42.8  41.6   Platelets 140 - 400 Thousand/uL 347  317  304       Most recent CMP    Latest Ref Rng & Units 11/26/2023    8:10 AM 08/17/2023    8:17 AM 01/02/2022    8:30 AM  CMP  Glucose 65 - 99 mg/dL 79  78  64   BUN 7 - 25 mg/dL 17  19  19    Creatinine 0.60 - 0.95 mg/dL 9.26  9.32  9.27   Sodium 135 - 146 mmol/L 140  142  140  Potassium 3.5 - 5.3 mmol/L 4.5  4.6  4.2   Chloride 98 - 110 mmol/L 101  103  102   CO2 20 - 32 mmol/L 31  31  30    Calcium 8.6 - 10.4 mg/dL 9.5  9.8  9.6   Total Protein 6.1 - 8.1 g/dL 6.1  6.2  6.8   Total Bilirubin 0.2 - 1.2 mg/dL 0.4  0.4  0.5   AST 10 - 35 U/L 21  26  22    ALT 6 - 29 U/L 17  18  15      Renal function CrCl cannot be calculated (Patient's most recent lab result is older than the maximum 21 days allowed.).  No results found for: HGBA1C  LDL Cholesterol (Calc)  Date Value Ref Range Status  11/26/2023 100 (H) mg/dL (calc) Final    Comment:    Reference range: <100 . Desirable range <100 mg/dL for primary prevention;   <70 mg/dL for patients with CHD or diabetic patients  with > or = 2 CHD risk factors. SABRA LDL-C is now calculated using the Martin-Hopkins  calculation, which is a validated novel method providing  better accuracy than the Friedewald equation in the  estimation of LDL-C.  Gladis APPLETHWAITE et al. SANDREA. 7986;689(80): 2061-2068  (http://education.[facility].com/faq/FAQ164)     Debby SAILOR. Magda, MD FACS [facility] Phone Number: [phone] 01/25/2024 4:02 PM   Total time spent on preparing this encounter including chart review, data review, collecting history, examining the patient, and coordinating care: 45 minutes  Portions of this report may have been transcribed using voice recognition software.  Every effort has been made to ensure accuracy; however, inadvertent computerized transcription errors may still be present.

## 2024-01-25 NOTE — Progress Notes (Signed)
 Otolaryngology Clinic Note  HPI:    Julia Hinton is a 89 y.o. female who presents as a return patient.  Julia Hinton returns today for cerumen removal.  She had seen Dr. Mable prior to his retirement for cerumen management and then most recently had seen her in February of this year.  She reports no acute issues today but does note she will occasionally have some right ear discomfort.  No drainage or bleeding.  PMH/Meds/All/SocHx/FamHx/ROS:   Medical History[1]  Surgical History[2]  No family history of bleeding disorders, wound healing problems or difficulty with anesthesia.      Current Medications[3]  A complete ROS was performed with pertinent positives/negatives noted in the HPI. The remainder of the ROS are negative.    Physical Exam:    Temp 97.3 F (36.3 C) (Temporal)   Ht 1.473 m (4' 10)   Wt 40.8 kg (90 lb)   BMI 18.81 kg/m   Constitutional:  Patient appears well-nourished and well-developed. No acute distress.   Head/Face: Facial features are symmetric. Skull is normocephalic. Hair and scalp are normal. Normal temporal artery pulses. TMJ shows no joint deformity swelling or erythema.   Eyes: Pupils are equal, round and reactive to light. Conjunctiva and lids are normal. Normal extraocular mobility. Normal vision by patient report.   Ears:     Right: Pinna and external meatus normal, normal ear canal skin and caliber.  Nonocclusive cerumen removed under the microscope.  No drainage. Tympanic membranes intact without effusion or infection.     Left: Pinna and external meatus normal, normal ear canal skin and caliber nonocclusive cerumen removed under the microscope.  No drainage. Tympanic membranes intact without effusion or infection.   Nose/Sinus/Nasopharynx: Septum is normal. Normal nasal mucosa. Normal inferior turbinates.    Oral cavity/Oropharynx: Lips normal, teeth and gums normal with good dentition, normal oral vestibule. Normal floor of  mouth, tongue and oral mucosa, no mucosal lesions, ulcer or mass, normal tongue mobility.  Hard and soft palate normal with normal mobility. One plus tonsils, no erythema or exudate. Base of tongue, retromolar trigone and oral pharynx normal. Normal sensation, mobility and gag.   Neck: No cervical lymphadenopathy, mass or swelling. Salivary glands normal to palpation without swelling, erythema or mass. Normal facial nerve function. Normal thyroid gland palpation.   Neurological: Alert and oriented to self, place and time.  Normal reflexes and motor skills, balance and coordination.   Psychiatric: No unusual anxiety or evidence of depression. Appropriate affect.     Independent Review of Additional Tests or Records:  None  Procedures:  Procedure Note - Removal of Cerumen Impaction  Risks/benefits and alternatives were discussed with patient who understands and agrees to proceed.  PROCEDURE: The patient was positioned and the ear was examined with the microscope.  Cerumen was gently removed from both ear canals using magnification and appropriate instrumentation, including forceps and suction. The patient tolerated the procedure well.   No complications.   Julia RAMAN. Spainhour, PA-C [facility]   Impression & Plans:   1) ceruminosis-bilateral   Cerumen removed as described above Return to clinic 6 months  Julia RAMAN. Spainhour, PA-C [facility]        [1] Past Medical History: Diagnosis Date  . Hearing loss   [2] Past Surgical History: Procedure Laterality Date  . CATARACT EXTRACTION     Procedure: CATARACT EXTRACTION  . TUBAL LIGATION     Procedure: TUBAL LIGATION  [3]  Current Outpatient Medications:  .  acetaminophen (  TYLENOL) 500 mg tablet, Take 1,000 mg by mouth., Disp: , Rfl:  .  alendronate  (FOSAMAX ) 70 mg tablet, , Disp: , Rfl:  .  cholecalciferol (VITAMIN D3) 1,000 unit (25 mcg) tablet, Take 1,000 Units by mouth., Disp: , Rfl:  .  cholecalciferol  (VITAMIN D3) 10 mcg (400 unit) tablet, Take  by mouth Once Daily., Disp: , Rfl:  .  ciprofloxacin-dexamethasone (CIPRODEX) 0.3-0.1 % otic suspension, 4 drops in the left ear nightly for 1 week then as needed for discharge or discomfort., Disp: 7.5 mL, Rfl: 1 .  tiZANidine (ZANAFLEX) 2 mg tablet, , Disp: , Rfl:  .  traMADoL (ULTRAM) 50 mg tablet, , Disp: , Rfl:  .  triamcinolone (KENALOG) 0.1 % ointment, , Disp: , Rfl:

## 2024-01-26 ENCOUNTER — Encounter: Payer: Self-pay | Admitting: Vascular Surgery

## 2024-01-26 ENCOUNTER — Ambulatory Visit (HOSPITAL_COMMUNITY)
Admission: RE | Admit: 2024-01-26 | Discharge: 2024-01-26 | Disposition: A | Discharge status: Home / Self Care | Source: Ambulatory Visit | Attending: Vascular Surgery | Admitting: Vascular Surgery

## 2024-01-26 ENCOUNTER — Ambulatory Visit (INDEPENDENT_AMBULATORY_CARE_PROVIDER_SITE_OTHER): Admitting: Vascular Surgery

## 2024-01-26 VITALS — BP 154/74 | HR 63 | Temp 97.9°F

## 2024-01-26 DIAGNOSIS — I83892 Varicose veins of left lower extremities with other complications: Secondary | ICD-10-CM

## 2024-01-26 DIAGNOSIS — I83899 Varicose veins of unspecified lower extremities with other complications: Secondary | ICD-10-CM | POA: Diagnosis present

## 2024-01-26 DIAGNOSIS — I872 Venous insufficiency (chronic) (peripheral): Secondary | ICD-10-CM | POA: Insufficient documentation

## 2024-02-10 ENCOUNTER — Telehealth: Payer: Self-pay

## 2024-02-10 NOTE — Telephone Encounter (Signed)
 Returned pt's daughter's call regarding sclerotherapy for bleeding ankle vein. Daughter did not have her schedule with her and asked that I call her mother to further discuss. Spoke to pt who was confused at first and then seemed to understand. She is interested in proceeding with sclerotherapy but would like her daughter to schedule the appt, as she will be the one bringing her in for it. They are going to see each other later this week, so pt's daughter can call us  back then to schedule.

## 2024-02-11 ENCOUNTER — Telehealth: Payer: Self-pay | Admitting: *Deleted

## 2024-02-11 NOTE — Telephone Encounter (Signed)
 Received [employer] Verification for Prolia  and it shows a 20% copay with Deductible met. Called [employer] Rep, Yavonda, and she stated that I would need to call [employer]. Called [employer] and they will not speak with Healthcare over the phone and directs you to a website [email or web address]  Patient Eligiblity:

## 2024-02-24 ENCOUNTER — Encounter: Payer: Self-pay | Admitting: Podiatry

## 2024-02-24 ENCOUNTER — Ambulatory Visit (INDEPENDENT_AMBULATORY_CARE_PROVIDER_SITE_OTHER): Admitting: Podiatry

## 2024-02-24 DIAGNOSIS — M79675 Pain in left toe(s): Secondary | ICD-10-CM

## 2024-02-24 DIAGNOSIS — L84 Corns and callosities: Secondary | ICD-10-CM | POA: Diagnosis not present

## 2024-02-24 DIAGNOSIS — B351 Tinea unguium: Secondary | ICD-10-CM | POA: Diagnosis not present

## 2024-02-24 DIAGNOSIS — M79674 Pain in right toe(s): Secondary | ICD-10-CM

## 2024-02-24 NOTE — Telephone Encounter (Signed)
 Spoke with Ronda and we will charge the 20% copay and if insurance reimburse then we would refund patient.   Called and spoke with patient and informed her of the 20% Copay and she agreed. Confirmed appointment with patient.

## 2024-02-24 NOTE — Progress Notes (Signed)
  Subjective:  Patient ID: Julia Hinton, female    DOB: 1932-09-25,   MRN: [id number]  Chief Complaint  Patient presents with   Nail Problem    I'm just having a little nail trim.    89 y.o. female presents for concern of thickened elongated and painful nails that are difficult to trim. Requesting to have them trimmed today. Relates burning and tingling in their feet. Patient is not diabetic Patient with new concern for callus in between her first and second toe on the right.   PCP:  Charlanne Fredia CROME, MD    . Denies any other pedal complaints. Denies n/v/f/c.   Past Medical History:  Diagnosis Date   Osteoporosis    PVC (premature ventricular contraction)    Vitamin D  deficiency     Objective:  Physical Exam: Vascular: DP/PT pulses 2/4 bilateral. CFT <3 seconds. Absent hair growth on digits. Edema noted to bilateral lower extremities. Xerosis noted bilaterally.  Skin. No lacerations or abrasions bilateral feet. Nails 2-5 on right and 3-5 on right  are thickened discolored and elongated with subungual debris. Hyperkeratotic tissue noted medial aspect of right second digit. Hyperkeratotic lesion noted medial first metatarsal head bilateral.  Musculoskeletal: MMT 5/5 bilateral lower extremities in DF, PF, Inversion and Eversion. Deceased ROM in DF of ankle joint. Hammertoe and bunion deformities noted bilatearl.  Neurological: Sensation intact to light touch. Protective sensation diminished bilateral.    Assessment:   1. Dermatophytosis of nail   2. Pain in toes of both feet   3. Callus of foot         Plan:  Patient was evaluated and treated and all questions answered. -Discussed and educated patient on foot care, especially with  regards to the vascular, neurological and musculoskeletal systems.  -Discussed supportive shoes at all times and checking feet regularly.  -Mechanically debrided all nails 1-5 bilateral using sterile nail nipper and filed with dremel without  incident   -Hyperkeratotic tissue debrided with chisel without incident x 2 to medial first metatarsal hads.  -Answered all patient questions -Patient to return  in  3 months for at risk foot care -Patient advised to call the office if any problems or questions arise in the meantime.   Asberry Failing, DPM

## 2024-03-01 ENCOUNTER — Ambulatory Visit: Attending: Vascular Surgery

## 2024-03-01 DIAGNOSIS — I83892 Varicose veins of left lower extremities with other complications: Secondary | ICD-10-CM | POA: Diagnosis not present

## 2024-03-01 NOTE — Progress Notes (Signed)
 Treated pt's LLE small reticular veins and spider veins that had previously bled with Asclera 1%. Pt received a total of 2 mL/20 mg of Asclera 1%, administered with a 27 gauge butterfly needle. Pt tolerated well. The area was a little sore to the touch. Easy access; anticipate good results. Pt was placed in knee high compression hose at the end of treatment. She was given post treatment care instructions on handout and verbal to her and her daughter. She will call should she have any questions/concerns that arise.

## 2024-03-16 ENCOUNTER — Encounter: Payer: Self-pay | Admitting: Internal Medicine

## 2024-03-16 ENCOUNTER — Non-Acute Institutional Stay: Admitting: Internal Medicine

## 2024-03-16 VITALS — BP 112/68 | HR 61 | Temp 97.2°F | Resp 18 | Ht 59.0 in | Wt 91.6 lb

## 2024-03-16 DIAGNOSIS — M545 Low back pain, unspecified: Secondary | ICD-10-CM

## 2024-03-16 DIAGNOSIS — H353211 Exudative age-related macular degeneration, right eye, with active choroidal neovascularization: Secondary | ICD-10-CM | POA: Diagnosis not present

## 2024-03-16 DIAGNOSIS — M810 Age-related osteoporosis without current pathological fracture: Secondary | ICD-10-CM

## 2024-03-16 DIAGNOSIS — G8929 Other chronic pain: Secondary | ICD-10-CM

## 2024-03-16 DIAGNOSIS — E785 Hyperlipidemia, unspecified: Secondary | ICD-10-CM | POA: Diagnosis not present

## 2024-03-16 DIAGNOSIS — I83899 Varicose veins of unspecified lower extremities with other complications: Secondary | ICD-10-CM

## 2024-03-16 NOTE — Progress Notes (Signed)
 Location:  [facility] of Service:  Clinic ([id number])  Provider:   Code Status:  Goals of Care:     11/16/2023    2:25 AM  Advanced Directives  Does Patient Have a Medical Advance Directive? Yes  Type of Estate agent of [place];Living will     Chief Complaint  Patient presents with   Medical Management of Chronic Issues    6 month follow up    HPI: Patient is a 89 y.o. female seen today for medical management of chronic diseases.   Lives in [place] [facility] with her husband Patient has history Osteoporosis and Right Eye Macular degeneration and Vit D Def     Osteoporosis On Prolia    Low back pain due to L1-5 Stenosis Has seen Dr Bonner and have it injected 3 times  Non surgical candidate With no relief Bleeding Varicose vein Underwent Sclerotherapy on 03/01/2024  Discussed the use of AI scribe software for clinical note transcription with the patient, who gave verbal consent to proceed.  History of Present Illness   Julia Hinton is an 89 year old female who presents for follow-up after vein surgery and ongoing management of back pain.  She follows post-sclerotherapy instructions after varicose vein injections, initially wearing compression socks 24 hours a day for three days and now nightly. No adverse effects from the procedure are noted.  Her back pain is intermittent and worsens with activities like bending. She takes Advil two to three times daily with meals for relief, as Tylenol is ineffective. She consumes a lot of fruit to manage potential constipation.  She feels undernourished due to limited access to a balanced diet, as she and her husband no longer drive, restricting her ability to eat off-campus.  She has osteoporosis and receives Prolia  injections. She recently underwent a bone density scan and is scheduled for her next Prolia  shot, with blood work due prior to the injection.      Past Medical History:   Diagnosis Date   Osteoporosis    PVC (premature ventricular contraction)    Vitamin D  deficiency     Past Surgical History:  Procedure Laterality Date   TUBAL LIGATION  1969    No Known Allergies  Outpatient Encounter Medications as of 03/16/2024  Medication Sig   CALCIUM PO Take 1 tablet by mouth daily.   Cholecalciferol 1000 units tablet Take 1,000 Units by mouth daily.   denosumab  (PROLIA ) 60 MG/ML SOSY injection Inject 60 mg into the skin every 6 (six) months.   ibuprofen (ADVIL) 100 MG chewable tablet Chew by mouth every 8 (eight) hours as needed.   acetaminophen (TYLENOL) 500 MG tablet Take 1,000 mg by mouth 2 (two) times daily. (Patient not taking: Reported on 03/16/2024)   diclofenac  Sodium (VOLTAREN  ARTHRITIS PAIN) 1 % GEL Apply 2 g topically 4 (four) times daily. Apply to right knee or hip as needed (Patient not taking: Reported on 03/16/2024)   [DISCONTINUED] meloxicam (MOBIC) 7.5 MG tablet Take 1 tablet every day by oral route for 14 days. (Patient not taking: Reported on 03/16/2024)   [DISCONTINUED] tiZANidine (ZANAFLEX) 2 MG tablet Take 1 tablet every 6 hours by oral route as needed for 10 days. (Patient not taking: Reported on 03/16/2024)   Facility-Administered Encounter Medications as of 03/16/2024  Medication   denosumab  (PROLIA ) injection 60 mg   [START ON 03/19/2024] denosumab  (PROLIA ) injection 60 mg    Review  of Systems:  Review of Systems  Constitutional:  Negative for activity change and appetite change.  HENT: Negative.    Respiratory:  Negative for cough and shortness of breath.   Cardiovascular:  Negative for leg swelling.  Gastrointestinal:  Negative for constipation.  Genitourinary: Negative.   Musculoskeletal:  Positive for back pain. Negative for arthralgias, gait problem and myalgias.  Skin: Negative.   Neurological:  Negative for dizziness and weakness.  Psychiatric/Behavioral:  Negative for confusion, dysphoric mood and sleep disturbance.      Health Maintenance  Topic Date Due   Medicare Annual Wellness (AWV)  08/19/2018   Influenza Vaccine  01/22/2024   DTaP/Tdap/Td (2 - Td or Tdap) 02/15/2024   COVID-19 Vaccine (6 - 2025-26 season) 02/22/2024   Zoster Vaccines- Shingrix (2 of 2) 05/13/2024 (Originally 10/21/2018)   Pneumococcal Vaccine: 50+ Years  Completed   DEXA SCAN  Completed   HPV VACCINES  Aged Out   Meningococcal B Vaccine  Aged Out    Physical Exam: Vitals:   03/16/24 1309  BP: 112/68  Pulse: 61  Resp: 18  Temp: (!) 97.2 F (36.2 C)  SpO2: 95%  Weight: 91 lb 9.6 oz (41.5 kg)  Height: 4' 11 (1.499 m)   Body mass index is 18.5 kg/m. Physical Exam Vitals reviewed.  Constitutional:      Appearance: Normal appearance.  HENT:     Head: Normocephalic.     Right Ear: Tympanic membrane normal.     Left Ear: Tympanic membrane normal.     Nose: Nose normal.     Mouth/Throat:     Mouth: Mucous membranes are moist.     Pharynx: Oropharynx is clear.  Eyes:     Pupils: Pupils are equal, round, and reactive to light.  Cardiovascular:     Rate and Rhythm: Normal rate and regular rhythm.     Pulses: Normal pulses.     Heart sounds: Normal heart sounds. No murmur heard. Pulmonary:     Effort: Pulmonary effort is normal.     Breath sounds: Normal breath sounds.  Abdominal:     General: Abdomen is flat. Bowel sounds are normal.     Palpations: Abdomen is soft.  Musculoskeletal:        General: No swelling.     Cervical back: Neck supple.     Comments: Chronic Varicose veins in her Legs  Skin:    General: Skin is warm.  Neurological:     General: No focal deficit present.     Mental Status: She is alert and oriented to person, place, and time.  Psychiatric:        Mood and Affect: Mood normal.        Thought Content: Thought content normal.     Labs reviewed: Basic Metabolic Panel: Recent Labs    05/28/23 0821 08/17/23 0817 11/26/23 0810  NA  --  142 140  K  --  4.6 4.5  CL  --  103 101   CO2  --  31 31  GLUCOSE  --  78 79  BUN  --  19 17  CREATININE  --  0.67 0.73  CALCIUM  --  9.8 9.5  TSH 1.88  --  2.20   Liver Function Tests: Recent Labs    08/17/23 0817 11/26/23 0810  AST 26 21  ALT 18 17  BILITOT 0.4 0.4  PROT 6.2 6.1   No results for input(s): LIPASE, AMYLASE in the last 8760 hours. No results  for input(s): AMMONIA in the last 8760 hours. CBC: Recent Labs    05/28/23 0821 08/17/23 0817 11/26/23 0810  WBC 7.7 6.6 7.6  NEUTROABS 5,144 4,316 5,054  HGB 13.9 14.2 11.6*  HCT 41.6 42.8 35.6  MCV 93.9 90.7 92.0  PLT 304 317 347   Lipid Panel: Recent Labs    05/28/23 0821 11/26/23 0810  CHOL 202* 191  HDL 74 70  LDLCALC 111* 100*  TRIG 80 116  CHOLHDL 2.7 2.7   No results found for: HGBA1C  Procedures since last visit: No results found.  Assessment/Plan Assessment and Plan    Chronic venous insufficiency status post recent sclerotherapy Status post sclerotherapy with no complications reported. - Continue wearing compression socks daily as instructed by the vein specialist.  Chronic low back pain Managed with Advil as needed. Advised omeprazole for stomach protection due to NSAID use. - Continue Advil as needed for pain management. - Advise against activities that exacerbate pain. - Take over-the-counter omeprazole daily to protect the stomach.  Chronic NSAID use with risk of gastrointestinal and renal complications Chronic Advil use with risk of gastrointestinal bleeding and renal complications. - Monitor for any signs of gastrointestinal bleeding or renal issues.  Impaired mobility and balance Impaired mobility and balance, uses walker for support. - Continue using walker for mobility support.  Age-related osteoporosis on Prolia  therapy DEXA showed good Improvement Prolia  therapy effective with good bone density results. - Order blood work prior to next Prolia  shot. - Administer Prolia  shot as scheduled.  Age-related  macular degeneration, right eye, inactive Inactive condition, previous injections discontinued.  Sensorineural hearing loss, left ear, with hearing aid use Managed with hearing aid, recently switched to smaller earbud-style.  Constipation, diet-controlled Managed with dietary intake of fruits, no current issues. - Continue high fruit intake to manage constipation.      .   Labs/tests ordered:  * No order type specified * Next appt:  03/28/2024

## 2024-03-16 NOTE — Patient Instructions (Addendum)
 1)You can get Covid Vaccine from Pharmacy, Also you are due for Tetanus shot TDAP and RSV vaccine.  2)You can take Prilosec 20 mg OTC once a day  for your stomach as you are taking Advil  3) Our records indicate you are due for an annual wellness visit, stop at check out to schedule (video or in-person).    4) Labs will be done on September 13, 2023 at [facility] at 7:45 am

## 2024-03-28 ENCOUNTER — Ambulatory Visit (INDEPENDENT_AMBULATORY_CARE_PROVIDER_SITE_OTHER): Admitting: Orthopedic Surgery

## 2024-03-28 DIAGNOSIS — M810 Age-related osteoporosis without current pathological fracture: Secondary | ICD-10-CM | POA: Diagnosis not present

## 2024-03-28 MED ORDER — DENOSUMAB 60 MG/ML ~~LOC~~ SOSY: 60.0000 mg | PREFILLED_SYRINGE | SUBCUTANEOUS | Status: AC

## 2024-03-28 NOTE — Progress Notes (Signed)
 Patient is in office today for a nurse visit for  Prolia Injection . Patient Injection was given in the  Right arm. Patient tolerated injection well.

## 2024-05-25 ENCOUNTER — Ambulatory Visit: Admitting: Podiatry

## 2024-05-25 ENCOUNTER — Encounter: Payer: Self-pay | Admitting: Podiatry

## 2024-05-25 DIAGNOSIS — M79675 Pain in left toe(s): Secondary | ICD-10-CM | POA: Diagnosis not present

## 2024-05-25 DIAGNOSIS — L84 Corns and callosities: Secondary | ICD-10-CM | POA: Diagnosis not present

## 2024-05-25 DIAGNOSIS — B351 Tinea unguium: Secondary | ICD-10-CM | POA: Diagnosis not present

## 2024-05-25 DIAGNOSIS — M79674 Pain in right toe(s): Secondary | ICD-10-CM | POA: Diagnosis not present

## 2024-05-25 NOTE — Progress Notes (Signed)
  Subjective:  Patient ID: Julia Hinton, female    DOB: Sep 12, 1932,   MRN: [id number]  Chief Complaint  Patient presents with   Nail Problem    I'm just having a toenail trim.    89 y.o. female presents for concern of thickened elongated and painful nails that are difficult to trim. Requesting to have them trimmed today. Relates burning and tingling in their feet. Patient is not diabetic Patient with new concern for callus in between her first and second toe on the right.   PCP:  Charlanne Fredia CROME, MD    . Denies any other pedal complaints. Denies n/v/f/c.   Past Medical History:  Diagnosis Date   Osteoporosis    PVC (premature ventricular contraction)    Vitamin D  deficiency     Objective:  Physical Exam: Vascular: DP/PT pulses 2/4 bilateral. CFT <3 seconds. Absent hair growth on digits. Edema noted to bilateral lower extremities. Xerosis noted bilaterally.  Skin. No lacerations or abrasions bilateral feet. Nails 2-5 on right and 3-5 on right  are thickened discolored and elongated with subungual debris. Hyperkeratotic tissue noted medial aspect of right second digit. Hyperkeratotic lesion noted medial first metatarsal head bilateral.  Musculoskeletal: MMT 5/5 bilateral lower extremities in DF, PF, Inversion and Eversion. Deceased ROM in DF of ankle joint. Hammertoe and bunion deformities noted bilatearl.  Neurological: Sensation intact to light touch. Protective sensation diminished bilateral.    Assessment:   1. Dermatophytosis of nail   2. Pain in toes of both feet   3. Callus of foot         Plan:  Patient was evaluated and treated and all questions answered. -Discussed and educated patient on foot care, especially with  regards to the vascular, neurological and musculoskeletal systems.  -Discussed supportive shoes at all times and checking feet regularly.  -Mechanically debrided all nails 1-5 bilateral using sterile nail nipper and filed with dremel without  incident   -Hyperkeratotic tissue debrided with chisel without incident x 2 to medial first metatarsal hads.  -Answered all patient questions -Patient to return  in  3 months for at risk foot care -Patient advised to call the office if any problems or questions arise in the meantime.   Asberry Failing, DPM

## 2024-08-24 ENCOUNTER — Ambulatory Visit: Admitting: Podiatry

## 2024-09-14 ENCOUNTER — Encounter: Payer: Self-pay | Admitting: Internal Medicine

## 2024-09-29 ENCOUNTER — Ambulatory Visit: Payer: Self-pay
